# Patient Record
Sex: Female | Born: 1970 | Race: Asian | Hispanic: No | Marital: Married | Smoking: Never smoker
Health system: Southern US, Community
[De-identification: ages and names within clinical notes are randomized; demographics above are authoritative.]

## PROBLEM LIST (undated history)

## (undated) ENCOUNTER — Inpatient Hospital Stay (HOSPITAL_COMMUNITY): Payer: Self-pay

## (undated) DIAGNOSIS — F329 Major depressive disorder, single episode, unspecified: Secondary | ICD-10-CM

## (undated) DIAGNOSIS — F32A Depression, unspecified: Secondary | ICD-10-CM

## (undated) DIAGNOSIS — Z789 Other specified health status: Secondary | ICD-10-CM

## (undated) HISTORY — PX: HAND SURGERY: SHX662

---

## 2014-05-23 LAB — PREGNANCY, URINE: Preg Test, Ur: POSITIVE

## 2014-06-13 ENCOUNTER — Encounter (HOSPITAL_COMMUNITY): Payer: Self-pay | Admitting: *Deleted

## 2014-06-13 ENCOUNTER — Inpatient Hospital Stay (HOSPITAL_COMMUNITY): Payer: Medicaid Other

## 2014-06-13 ENCOUNTER — Inpatient Hospital Stay (HOSPITAL_COMMUNITY)
Admission: AD | Admit: 2014-06-13 | Discharge: 2014-06-13 | Disposition: A | Discharge status: Home / Self Care | Payer: Medicaid Other | Source: Ambulatory Visit | Attending: Obstetrics & Gynecology | Admitting: Obstetrics & Gynecology

## 2014-06-13 DIAGNOSIS — O9989 Other specified diseases and conditions complicating pregnancy, childbirth and the puerperium: Secondary | ICD-10-CM | POA: Insufficient documentation

## 2014-06-13 DIAGNOSIS — Z3A20 20 weeks gestation of pregnancy: Secondary | ICD-10-CM | POA: Insufficient documentation

## 2014-06-13 DIAGNOSIS — O0992 Supervision of high risk pregnancy, unspecified, second trimester: Secondary | ICD-10-CM

## 2014-06-13 DIAGNOSIS — R109 Unspecified abdominal pain: Secondary | ICD-10-CM | POA: Insufficient documentation

## 2014-06-13 DIAGNOSIS — O0932 Supervision of pregnancy with insufficient antenatal care, second trimester: Secondary | ICD-10-CM | POA: Insufficient documentation

## 2014-06-13 DIAGNOSIS — O09212 Supervision of pregnancy with history of pre-term labor, second trimester: Secondary | ICD-10-CM

## 2014-06-13 DIAGNOSIS — O09512 Supervision of elderly primigravida, second trimester: Secondary | ICD-10-CM | POA: Diagnosis present

## 2014-06-13 DIAGNOSIS — O26899 Other specified pregnancy related conditions, unspecified trimester: Secondary | ICD-10-CM

## 2014-06-13 DIAGNOSIS — O09892 Supervision of other high risk pregnancies, second trimester: Secondary | ICD-10-CM

## 2014-06-13 DIAGNOSIS — O0930 Supervision of pregnancy with insufficient antenatal care, unspecified trimester: Secondary | ICD-10-CM

## 2014-06-13 DIAGNOSIS — Z789 Other specified health status: Secondary | ICD-10-CM | POA: Diagnosis present

## 2014-06-13 HISTORY — DX: Other specified health status: Z78.9

## 2014-06-13 HISTORY — DX: Major depressive disorder, single episode, unspecified: F32.9

## 2014-06-13 HISTORY — DX: Depression, unspecified: F32.A

## 2014-06-13 LAB — OB RESULTS CONSOLE ABO/RH: RH TYPE: POSITIVE

## 2014-06-13 LAB — URINALYSIS, ROUTINE W REFLEX MICROSCOPIC
Bilirubin Urine: NEGATIVE
GLUCOSE, UA: NEGATIVE mg/dL
Hgb urine dipstick: NEGATIVE
Ketones, ur: NEGATIVE mg/dL
Leukocytes, UA: NEGATIVE
NITRITE: NEGATIVE
PH: 6 (ref 5.0–8.0)
Protein, ur: NEGATIVE mg/dL
SPECIFIC GRAVITY, URINE: 1.015 (ref 1.005–1.030)
Urobilinogen, UA: 0.2 mg/dL (ref 0.0–1.0)

## 2014-06-13 LAB — DIFFERENTIAL
BASOS PCT: 0 % (ref 0–1)
Basophils Absolute: 0 10*3/uL (ref 0.0–0.1)
Eosinophils Absolute: 0.2 10*3/uL (ref 0.0–0.7)
Eosinophils Relative: 2 % (ref 0–5)
Lymphocytes Relative: 18 % (ref 12–46)
Lymphs Abs: 2.1 10*3/uL (ref 0.7–4.0)
MONO ABS: 0.5 10*3/uL (ref 0.1–1.0)
MONOS PCT: 4 % (ref 3–12)
Neutro Abs: 9 10*3/uL — ABNORMAL HIGH (ref 1.7–7.7)
Neutrophils Relative %: 76 % (ref 43–77)

## 2014-06-13 LAB — CBC
HEMATOCRIT: 33 % — AB (ref 36.0–46.0)
HEMOGLOBIN: 11 g/dL — AB (ref 12.0–15.0)
MCH: 29.7 pg (ref 26.0–34.0)
MCHC: 33.3 g/dL (ref 30.0–36.0)
MCV: 89.2 fL (ref 78.0–100.0)
Platelets: 288 10*3/uL (ref 150–400)
RBC: 3.7 MIL/uL — ABNORMAL LOW (ref 3.87–5.11)
RDW: 13.4 % (ref 11.5–15.5)
WBC: 11.8 10*3/uL — ABNORMAL HIGH (ref 4.0–10.5)

## 2014-06-13 LAB — ABO/RH: ABO/RH(D): AB POS

## 2014-06-13 LAB — WET PREP, GENITAL
CLUE CELLS WET PREP: NONE SEEN
Trich, Wet Prep: NONE SEEN
Yeast Wet Prep HPF POC: NONE SEEN

## 2014-06-13 LAB — TYPE AND SCREEN
ABO/RH(D): AB POS
Antibody Screen: NEGATIVE

## 2014-06-13 NOTE — MAU Note (Signed)
Has had blood work done in office for preg., not planning to go there for PNV due to insurance.  Having abd pain, pain with urination, also wants to know sex of baby.  Pain started on Friday. Also has freq of urination

## 2014-06-13 NOTE — Discharge Instructions (Signed)
Preterm Labor Information Preterm labor is when labor starts at less than 37 weeks of pregnancy. The normal length of a pregnancy is 39 to 41 weeks. CAUSES Often, there is no identifiable underlying cause as to why a woman goes into preterm labor. One of the most common known causes of preterm labor is infection. Infections of the uterus, cervix, vagina, amniotic sac, bladder, kidney, or even the lungs (pneumonia) can cause labor to start. Other suspected causes of preterm labor include:   Urogenital infections, such as yeast infections and bacterial vaginosis.   Uterine abnormalities (uterine shape, uterine septum, fibroids, or bleeding from the placenta).   A cervix that has been operated on (it may fail to stay closed).   Malformations in the fetus.   Multiple gestations (twins, triplets, and so on).   Breakage of the amniotic sac.  RISK FACTORS  Having a previous history of preterm labor.   Having premature rupture of membranes (PROM).   Having a placenta that covers the opening of the cervix (placenta previa).   Having a placenta that separates from the uterus (placental abruption).   Having a cervix that is too weak to hold the fetus in the uterus (incompetent cervix).   Having too much fluid in the amniotic sac (polyhydramnios).   Taking illegal drugs or smoking while pregnant.   Not gaining enough weight while pregnant.   Being younger than 4918 and older than 41 years old.   Having a low socioeconomic status.   Being African American. SYMPTOMS Signs and symptoms of preterm labor include:   Menstrual-like cramps, abdominal pain, or back pain.  Uterine contractions that are regular, as frequent as six in an hour, regardless of their intensity (may be mild or painful).  Contractions that start on the top of the uterus and spread down to the lower abdomen and back.   A sense of increased pelvic pressure.   A watery or bloody mucus discharge that  comes from the vagina.  TREATMENT Depending on the length of the pregnancy and other circumstances, your health care provider may suggest bed rest. If necessary, there are medicines that can be given to stop contractions and to mature the fetal lungs. If labor happens before 34 weeks of pregnancy, a prolonged hospital stay may be recommended. Treatment depends on the condition of both you and the fetus.  WHAT SHOULD YOU DO IF YOU THINK YOU ARE IN PRETERM LABOR? Call your health care provider right away. You will need to go to the hospital to get checked immediately. HOW CAN YOU PREVENT PRETERM LABOR IN FUTURE PREGNANCIES? You should:   Stop smoking if you smoke.  Maintain healthy weight gain and avoid chemicals and drugs that are not necessary.  Be watchful for any type of infection.  Inform your health care provider if you have a known history of preterm labor. Document Released: 08/23/2003 Document Revised: 02/02/2013 Document Reviewed: 07/05/2012 [facility] Patient Information 2015 [place], [place]. This information is not intended to replace advice given to you by your health care provider. Make sure you discuss any questions you have with your health care provider.  Second Trimester of Pregnancy The second trimester is from week 13 through week 28, month 4 through 6. This is often the time in pregnancy that you feel your best. Often times, morning sickness has lessened or quit. You may have more energy, and you may get hungry more often. Your unborn baby (fetus) is growing rapidly. At the end of the sixth month,  he or she is about 9 inches long and weighs about 1 pounds. You will likely feel the baby move (quickening) between 18 and 20 weeks of pregnancy. HOME CARE   Avoid all smoking, herbs, and alcohol. Avoid drugs not approved by your doctor.  Only take medicine as told by your doctor. Some medicines are safe and some are not during pregnancy.  Exercise only as told by your doctor.  Stop exercising if you start having cramps.  Eat regular, healthy meals.  Wear a good support bra if your breasts are tender.  Do not use hot tubs, steam rooms, or saunas.  Wear your seat belt when driving.  Avoid raw meat, uncooked cheese, and liter boxes and soil used by cats.  Take your prenatal vitamins.  Try taking medicine that helps you poop (stool softener) as needed, and if your doctor approves. Eat more fiber by eating fresh fruit, vegetables, and whole grains. Drink enough fluids to keep your pee (urine) clear or pale yellow.  Take warm water baths (sitz baths) to soothe pain or discomfort caused by hemorrhoids. Use hemorrhoid cream if your doctor approves.  If you have puffy, bulging veins (varicose veins), wear support hose. Raise (elevate) your feet for 15 minutes, 3-4 times a day. Limit salt in your diet.  Avoid heavy lifting, wear low heals, and sit up straight.  Rest with your legs raised if you have leg cramps or low back pain.  Visit your dentist if you have not gone during your pregnancy. Use a soft toothbrush to brush your teeth. Be gentle when you floss.  You can have sex (intercourse) unless your doctor tells you not to.  Go to your doctor visits. GET HELP IF:   You feel dizzy.  You have mild cramps or pressure in your lower belly (abdomen).  You have a nagging pain in your belly area.  You continue to feel sick to your stomach (nauseous), throw up (vomit), or have watery poop (diarrhea).  You have bad smelling fluid coming from your vagina.  You have pain with peeing (urination). GET HELP RIGHT AWAY IF:   You have a fever.  You are leaking fluid from your vagina.  You have spotting or bleeding from your vagina.  You have severe belly cramping or pain.  You lose or gain weight rapidly.  You have trouble catching your breath and have chest pain.  You notice sudden or extreme puffiness (swelling) of your face, hands, ankles, feet, or  legs.  You have not felt the baby move in over an hour.  You have severe headaches that do not go away with medicine.  You have vision changes. Document Released: 08/27/2009 Document Revised: 09/27/2012 Document Reviewed: 08/03/2012 [facility] Patient Information 2015 [place]Care, [place]LLC. This information is not intended to replace advice given to you by your health care provider. Make sure you discuss any questions you have with your health care provider.

## 2014-06-13 NOTE — MAU Provider Note (Signed)
Chief Complaint: Abdominal Pain and Dysuria  First Provider Initiated Contact with Patient 06/13/14 1226     SUBJECTIVE HPI: Tracie Tucker is a 43 y.o. [id number] at [redacted]w[redacted]d per pregnancy verification letter from [facility]. Uncertain LMP in July 2015. She presents with low abd cramping, dysuria and frequency of urination since 06/09/14. Worse initially, but now 2/10 on pain scale. No PNC except for ?blood pregnancy test at [facility]. Will not be getting PNC there because she does not have insurance and will be applying for emergency Medicaid. Wants to know sex of baby and initiate PNC.   All other deliveries in [place]. No PNC. Homebirths. Three neonatal deaths w/ in a few days after birth presumably from prematurity. Pt states she was 7-8 months with those babies.    Attempted to get [place] interpreter using [employer]. None available. Pt's friend at [facility]. Marliss Czar and [place]. Interpreted since no other interpreter available.  Past Medical History  Diagnosis Date  . Medical history non-contributory   . Depression     in [place], everything is ok here   OB History  Gravida Para Term Preterm AB SAB TAB Ectopic Multiple Living  8 7 4 3  0 0 0   4    # Outcome Date GA Lbr Len/2nd Weight Sex Delivery Anes PTL Lv  8 Current           7 Preterm           6 Preterm           5 Preterm           4 Term           3 Term           2 Term           1 Term             Obstetric Comments  The preterm del were 7-66months, babies lived a few days then died. All deliveries were in [place]   Past Surgical History  Procedure Laterality Date  . No past surgeries     History   Social History  . Marital Status: Married    Spouse Name: N/A    Number of Children: N/A  . Years of Education: N/A   Occupational History  . Not on file.   Social History Main Topics  . Smoking status: Never Smoker   . Smokeless tobacco: Never Used  . Alcohol Use: No  . Drug Use: No  . Sexual  Activity: Not on file   Other Topics Concern  . Not on file   Social History Narrative  . No narrative on file   No current facility-administered medications on file prior to encounter.   No current outpatient prescriptions on file prior to encounter.   No Known Allergies  ROS: Pertinent positive items in HPI. Neg for fever, chills, hematuria, flank pain, VB, LOF, vaginal discharge.   OBJECTIVE Blood pressure 123/77, pulse 72, temperature 98.9 F (37.2 C), temperature source Oral, resp. rate 16, weight 55.339 kg (122 lb). GENERAL: Well-developed, well-nourished female in no acute distress.  HEENT: Normocephalic HEART: normal rate RESP: normal effort ABDOMEN: Soft, mild SP TTP. Pos BS. No CVAT. Gravid. 21 cm fundal height.  EXTREMITIES: Nontender, no edema NEURO: Alert and oriented SPECULUM EXAM: NEFG, physiologic discharge, no blood noted, cervix clean BIMANUAL: cervix closed and long; uterus 20-22 week size, no adnexal tenderness or masses FHR 136 by doppler.   LAB  RESULTS Results for orders placed or performed during the hospital encounter of 06/13/14 (from the past 24 hour(s))  Urinalysis, Routine w reflex microscopic     Status: None   Collection Time: 06/13/14 11:29 AM  Result Value Ref Range   Color, Urine YELLOW YELLOW   APPearance CLEAR CLEAR   Specific Gravity, Urine 1.015 1.005 - 1.030   pH 6.0 5.0 - 8.0   Glucose, UA NEGATIVE NEGATIVE mg/dL   Hgb urine dipstick NEGATIVE NEGATIVE   Bilirubin Urine NEGATIVE NEGATIVE   Ketones, ur NEGATIVE NEGATIVE mg/dL   Protein, ur NEGATIVE NEGATIVE mg/dL   Urobilinogen, UA 0.2 0.0 - 1.0 mg/dL   Nitrite NEGATIVE NEGATIVE   Leukocytes, UA NEGATIVE NEGATIVE  Wet prep, genital     Status: Abnormal   Collection Time: 06/13/14 12:40 PM  Result Value Ref Range   Yeast Wet Prep HPF POC NONE SEEN NONE SEEN   Trich, Wet Prep NONE SEEN NONE SEEN   Clue Cells Wet Prep HPF POC NONE SEEN NONE SEEN   WBC, Wet Prep HPF POC FEW (A)  NONE SEEN  CBC     Status: Abnormal   Collection Time: 06/13/14  1:21 PM  Result Value Ref Range   WBC 11.8 (H) 4.0 - 10.5 K/uL   RBC 3.70 (L) 3.87 - 5.11 MIL/uL   Hemoglobin 11.0 (L) 12.0 - 15.0 g/dL   HCT 16.133.0 (L) 09.636.0 - 04.546.0 %   MCV 89.2 78.0 - 100.0 fL   MCH 29.7 26.0 - 34.0 pg   MCHC 33.3 30.0 - 36.0 g/dL   RDW 40.913.4 81.111.5 - 91.415.5 %   Platelets 288 150 - 400 K/uL  Differential     Status: Abnormal   Collection Time: 06/13/14  1:21 PM  Result Value Ref Range   Neutrophils Relative % 76 43 - 77 %   Neutro Abs 9.0 (H) 1.7 - 7.7 K/uL   Lymphocytes Relative 18 12 - 46 %   Lymphs Abs 2.1 0.7 - 4.0 K/uL   Monocytes Relative 4 3 - 12 %   Monocytes Absolute 0.5 0.1 - 1.0 K/uL   Eosinophils Relative 2 0 - 5 %   Eosinophils Absolute 0.2 0.0 - 0.7 K/uL   Basophils Relative 0 0 - 1 %   Basophils Absolute 0.0 0.0 - 0.1 K/uL    IMAGING CL 4.02 cm  MAU COURSE UA, Ob Limited, OB urine culture and Ob panel.   ASSESSMENT 1. History of preterm delivery, currently pregnant in second trimester   2. Abdominal pain affecting pregnancy, antepartum   3. No prenatal care in current pregnancy in second trimester    PLAN Discharge home in stable condition. [facility].  Needs 17-P for Hx PTD. Urine culture GC/Chlm pending Follow-up Information    [facility]   Specialty:  Obstetrics and Gynecology   Why:  will call you to schedule an appointment   Contact information:   [address] [place] [id number] [phone]      [facility].   Why:  As needed in emergencies   Contact information:   [address] [id number] mc [place] [id number] [phone]       Medication List    TAKE these medications        prenatal multivitamin Tabs tablet  Take 1 tablet by mouth daily at 12 noon.  [place] Valley[place]ia Ramello Cordial, Pennsylvania[place] IslandCNM 06/13/2014   12:24 PM

## 2014-06-14 LAB — GC/CHLAMYDIA PROBE AMP
CT Probe RNA: NEGATIVE
GC PROBE AMP APTIMA: NEGATIVE

## 2014-06-14 LAB — CULTURE, OB URINE: SPECIAL REQUESTS: NORMAL

## 2014-06-14 LAB — RPR

## 2014-06-14 LAB — HIV ANTIBODY (ROUTINE TESTING W REFLEX): HIV 1&2 Ab, 4th Generation: NONREACTIVE

## 2014-06-14 LAB — RUBELLA SCREEN: Rubella: 19.9 Index — ABNORMAL HIGH (ref <0.90)

## 2014-06-14 LAB — HEPATITIS B SURFACE ANTIGEN: Hepatitis B Surface Ag: NEGATIVE

## 2014-06-16 NOTE — L&D Delivery Note (Cosign Needed)
Delivery Note I was called to MAU to attend this delivery that had just happened with Awilda BillJen Rasch NP present.  At 2:04 AM a viable female was delivered via Vaginal, Spontaneous Delivery (Presentation: ; Occiput Anterior).  APGAR: 9, 9; weight 5 lb 15.2 oz (2700 g).  When I arrived, infant had been dried and was on pt's abd. I clamped and cut the cord and collected the hospital cord blood sample tubes. Placenta status: Intact, Spontaneous.  Cord: 3 vessels with the following complications: None.  FF after delivery but Pit 10mu IM was given as a precaution.  Anesthesia: None  Episiotomy: None Lacerations: None Est. Blood Loss (mL): 250  Mom to postpartum.  Baby to Couplet care / Skin to Skin.  Cam HaiSHAW, Kendry Pfarr CNM 10/14/2014, 10:21 AM

## 2014-06-20 ENCOUNTER — Other Ambulatory Visit (HOSPITAL_COMMUNITY): Payer: Medicaid Other

## 2014-06-22 ENCOUNTER — Ambulatory Visit (HOSPITAL_COMMUNITY)
Admit: 2014-06-22 | Discharge: 2014-06-22 | Disposition: A | Discharge status: Home / Self Care | Payer: Medicaid Other | Attending: Advanced Practice Midwife | Admitting: Advanced Practice Midwife

## 2014-06-22 ENCOUNTER — Other Ambulatory Visit (HOSPITAL_COMMUNITY): Payer: Self-pay | Admitting: Advanced Practice Midwife

## 2014-06-22 ENCOUNTER — Encounter: Payer: Self-pay | Admitting: Advanced Practice Midwife

## 2014-06-22 DIAGNOSIS — O09522 Supervision of elderly multigravida, second trimester: Secondary | ICD-10-CM | POA: Diagnosis not present

## 2014-06-22 DIAGNOSIS — O09212 Supervision of pregnancy with history of pre-term labor, second trimester: Secondary | ICD-10-CM | POA: Insufficient documentation

## 2014-06-22 DIAGNOSIS — R109 Unspecified abdominal pain: Principal | ICD-10-CM

## 2014-06-22 DIAGNOSIS — O35BXX0 Maternal care for other (suspected) fetal abnormality and damage, fetal cardiac anomalies, not applicable or unspecified: Secondary | ICD-10-CM | POA: Insufficient documentation

## 2014-06-22 DIAGNOSIS — Z3A21 21 weeks gestation of pregnancy: Secondary | ICD-10-CM | POA: Insufficient documentation

## 2014-06-22 DIAGNOSIS — O09892 Supervision of other high risk pregnancies, second trimester: Secondary | ICD-10-CM

## 2014-06-22 DIAGNOSIS — O0932 Supervision of pregnancy with insufficient antenatal care, second trimester: Secondary | ICD-10-CM | POA: Diagnosis not present

## 2014-06-22 DIAGNOSIS — O09512 Supervision of elderly primigravida, second trimester: Secondary | ICD-10-CM

## 2014-06-22 DIAGNOSIS — O0992 Supervision of high risk pregnancy, unspecified, second trimester: Secondary | ICD-10-CM

## 2014-06-22 DIAGNOSIS — Z3689 Encounter for other specified antenatal screening: Secondary | ICD-10-CM | POA: Insufficient documentation

## 2014-06-22 DIAGNOSIS — Z36 Encounter for antenatal screening of mother: Secondary | ICD-10-CM | POA: Diagnosis present

## 2014-06-22 DIAGNOSIS — O26899 Other specified pregnancy related conditions, unspecified trimester: Secondary | ICD-10-CM

## 2014-06-22 DIAGNOSIS — O09292 Supervision of pregnancy with other poor reproductive or obstetric history, second trimester: Secondary | ICD-10-CM | POA: Diagnosis not present

## 2014-06-22 DIAGNOSIS — O358XX0 Maternal care for other (suspected) fetal abnormality and damage, not applicable or unspecified: Secondary | ICD-10-CM | POA: Insufficient documentation

## 2014-06-26 ENCOUNTER — Ambulatory Visit (INDEPENDENT_AMBULATORY_CARE_PROVIDER_SITE_OTHER): Payer: Medicaid Other | Admitting: Family Medicine

## 2014-06-26 ENCOUNTER — Other Ambulatory Visit (HOSPITAL_COMMUNITY)
Admission: RE | Admit: 2014-06-26 | Discharge: 2014-06-26 | Disposition: A | Discharge status: Home / Self Care | Payer: Medicaid Other | Source: Ambulatory Visit | Attending: Family Medicine | Admitting: Family Medicine

## 2014-06-26 ENCOUNTER — Encounter: Payer: Self-pay | Admitting: Family Medicine

## 2014-06-26 ENCOUNTER — Telehealth: Payer: Self-pay

## 2014-06-26 VITALS — BP 107/72 | HR 75 | Temp 97.6°F | Ht 60.0 in | Wt 123.4 lb

## 2014-06-26 DIAGNOSIS — O09512 Supervision of elderly primigravida, second trimester: Secondary | ICD-10-CM

## 2014-06-26 DIAGNOSIS — Z1151 Encounter for screening for human papillomavirus (HPV): Secondary | ICD-10-CM | POA: Diagnosis present

## 2014-06-26 DIAGNOSIS — Z01419 Encounter for gynecological examination (general) (routine) without abnormal findings: Secondary | ICD-10-CM | POA: Insufficient documentation

## 2014-06-26 DIAGNOSIS — O09892 Supervision of other high risk pregnancies, second trimester: Secondary | ICD-10-CM

## 2014-06-26 DIAGNOSIS — O0992 Supervision of high risk pregnancy, unspecified, second trimester: Secondary | ICD-10-CM

## 2014-06-26 DIAGNOSIS — O09212 Supervision of pregnancy with history of pre-term labor, second trimester: Secondary | ICD-10-CM

## 2014-06-26 MED ORDER — HYDROXYPROGESTERONE CAPROATE 250 MG/ML IM OIL: 250.0000 mg | TOPICAL_OIL | Freq: Once | INTRAMUSCULAR | Status: DC

## 2014-06-26 NOTE — Patient Instructions (Signed)
Second Trimester of Pregnancy The second trimester is from week 13 through week 28, months 4 through 6. The second trimester is often a time when you feel your best. Your body has also adjusted to being pregnant, and you begin to feel better physically. Usually, morning sickness has lessened or quit completely, you may have more energy, and you may have an increase in appetite. The second trimester is also a time when the fetus is growing rapidly. At the end of the sixth month, the fetus is about 9 inches long and weighs about 1 pounds. You will likely begin to feel the baby move (quickening) between 18 and 20 weeks of the pregnancy. BODY CHANGES Your body goes through many changes during pregnancy. The changes vary from woman to woman.   Your weight will continue to increase. You will notice your lower abdomen bulging out.  You may begin to get stretch marks on your hips, abdomen, and breasts.  You may develop headaches that can be relieved by medicines approved by your health care provider.  You may urinate more often because the fetus is pressing on your bladder.  You may develop or continue to have heartburn as a result of your pregnancy.  You may develop constipation because certain hormones are causing the muscles that push waste through your intestines to slow down.  You may develop hemorrhoids or swollen, bulging veins (varicose veins).  You may have back pain because of the weight gain and pregnancy hormones relaxing your joints between the bones in your pelvis and as a result of a shift in weight and the muscles that support your balance.  Your breasts will continue to grow and be tender.  Your gums may bleed and may be sensitive to brushing and flossing.  Dark spots or blotches (chloasma, mask of pregnancy) may develop on your face. This will likely fade after the baby is born.  A dark line from your belly button to the pubic area (linea nigra) may appear. This will likely  fade after the baby is born.  You may have changes in your hair. These can include thickening of your hair, rapid growth, and changes in texture. Some women also have hair loss during or after pregnancy, or hair that feels dry or thin. Your hair will most likely return to normal after your baby is born. WHAT TO EXPECT AT YOUR PRENATAL VISITS During a routine prenatal visit:  You will be weighed to make sure you and the fetus are growing normally.  Your blood pressure will be taken.  Your abdomen will be measured to track your baby's growth.  The fetal heartbeat will be listened to.  Any test results from the previous visit will be discussed. Your health care provider may ask you:  How you are feeling.  If you are feeling the baby move.  If you have had any abnormal symptoms, such as leaking fluid, bleeding, severe headaches, or abdominal cramping.  If you have any questions. Other tests that may be performed during your second trimester include:  Blood tests that check for:  Low iron levels (anemia).  Gestational diabetes (between 24 and 28 weeks).  Rh antibodies.  Urine tests to check for infections, diabetes, or protein in the urine.  An ultrasound to confirm the proper growth and development of the baby.  An amniocentesis to check for possible genetic problems.  Fetal screens for spina bifida and Down syndrome. HOME CARE INSTRUCTIONS   Avoid all smoking, herbs, alcohol, and unprescribed   drugs. These chemicals affect the formation and growth of the baby.  Follow your health care provider's instructions regarding medicine use. There are medicines that are either safe or unsafe to take during pregnancy.  Exercise only as directed by your health care provider. Experiencing uterine cramps is a good sign to stop exercising.  Continue to eat regular, healthy meals.  Wear a good support bra for breast tenderness.  Do not use hot tubs, steam rooms, or saunas.  Wear  your seat belt at all times when driving.  Avoid raw meat, uncooked cheese, cat litter boxes, and soil used by cats. These carry germs that can cause birth defects in the baby.  Take your prenatal vitamins.  Try taking a stool softener (if your health care provider approves) if you develop constipation. Eat more high-fiber foods, such as fresh vegetables or fruit and whole grains. Drink plenty of fluids to keep your urine clear or pale yellow.  Take warm sitz baths to soothe any pain or discomfort caused by hemorrhoids. Use hemorrhoid cream if your health care provider approves.  If you develop varicose veins, wear support hose. Elevate your feet for 15 minutes, 3-4 times a day. Limit salt in your diet.  Avoid heavy lifting, wear low heel shoes, and practice good posture.  Rest with your legs elevated if you have leg cramps or low back pain.  Visit your dentist if you have not gone yet during your pregnancy. Use a soft toothbrush to brush your teeth and be gentle when you floss.  A sexual relationship may be continued unless your health care provider directs you otherwise.  Continue to go to all your prenatal visits as directed by your health care provider. SEEK MEDICAL CARE IF:   You have dizziness.  You have mild pelvic cramps, pelvic pressure, or nagging pain in the abdominal area.  You have persistent nausea, vomiting, or diarrhea.  You have a bad smelling vaginal discharge.  You have pain with urination. SEEK IMMEDIATE MEDICAL CARE IF:   You have a fever.  You are leaking fluid from your vagina.  You have spotting or bleeding from your vagina.  You have severe abdominal cramping or pain.  You have rapid weight gain or loss.  You have shortness of breath with chest pain.  You notice sudden or extreme swelling of your face, hands, ankles, feet, or legs.  You have not felt your baby move in over an hour.  You have severe headaches that do not go away with  medicine.  You have vision changes. Document Released: 05/27/2001 Document Revised: 06/07/2013 Document Reviewed: 08/03/2012 Exit[employer] Patient Information 2015 [employer]. This information is not intended to replace advice given to you by your health care provider. Make sure you discuss any questions you have with your health care provider.  Breastfeeding Deciding to breastfeed is one of the best choices you can make for you and your baby. A change in hormones during pregnancy causes your breast tissue to grow and increases the number and size of your milk ducts. These hormones also allow proteins, sugars, and fats from your blood supply to make breast milk in your milk-producing glands. Hormones prevent breast milk from being released before your baby is born as well as prompt milk flow after birth. Once breastfeeding has begun, thoughts of your baby, as well as his or her sucking or crying, can stimulate the release of milk from your milk-producing glands.  BENEFITS OF BREASTFEEDING For Your Baby  Your first   milk (colostrum) helps your baby's digestive system function better.   There are antibodies in your milk that help your baby fight off infections.   Your baby has a lower incidence of asthma, allergies, and sudden infant death syndrome.   The nutrients in breast milk are better for your baby than infant formulas and are designed uniquely for your baby's needs.   Breast milk improves your baby's brain development.   Your baby is less likely to develop other conditions, such as childhood obesity, asthma, or type 2 diabetes mellitus.  For You   Breastfeeding helps to create a very special bond between you and your baby.   Breastfeeding is convenient. Breast milk is always available at the correct temperature and costs nothing.   Breastfeeding helps to burn calories and helps you lose the weight gained during pregnancy.   Breastfeeding makes your uterus contract to its  prepregnancy size faster and slows bleeding (lochia) after you give birth.   Breastfeeding helps to lower your risk of developing type 2 diabetes mellitus, osteoporosis, and breast or ovarian cancer later in life. SIGNS THAT YOUR BABY IS HUNGRY Early Signs of Hunger  Increased alertness or activity.  Stretching.  Movement of the head from side to side.  Movement of the head and opening of the mouth when the corner of the mouth or cheek is stroked (rooting).  Increased sucking sounds, smacking lips, cooing, sighing, or squeaking.  Hand-to-mouth movements.  Increased sucking of fingers or hands. Late Signs of Hunger  Fussing.  Intermittent crying. Extreme Signs of Hunger Signs of extreme hunger will require calming and consoling before your baby will be able to breastfeed successfully. Do not wait for the following signs of extreme hunger to occur before you initiate breastfeeding:   Restlessness.  A loud, strong cry.   Screaming. BREASTFEEDING BASICS Breastfeeding Initiation  Find a comfortable place to sit or lie down, with your neck and back well supported.  Place a pillow or rolled up blanket under your baby to bring him or her to the level of your breast (if you are seated). Nursing pillows are specially designed to help support your arms and your baby while you breastfeed.  Make sure that your baby's abdomen is facing your abdomen.   Gently massage your breast. With your fingertips, massage from your chest wall toward your nipple in a circular motion. This encourages milk flow. You may need to continue this action during the feeding if your milk flows slowly.  Support your breast with 4 fingers underneath and your thumb above your nipple. Make sure your fingers are well away from your nipple and your baby's mouth.   Stroke your baby's lips gently with your finger or nipple.   When your baby's mouth is open wide enough, quickly bring your baby to your breast,  placing your entire nipple and as much of the colored area around your nipple (areola) as possible into your baby's mouth.   More areola should be visible above your baby's upper lip than below the lower lip.   Your baby's tongue should be between his or her lower gum and your breast.   Ensure that your baby's mouth is correctly positioned around your nipple (latched). Your baby's lips should create a seal on your breast and be turned out (everted).  It is common for your baby to suck about 2-3 minutes in order to start the flow of breast milk. Latching Teaching your baby how to latch on to your breast   properly is very important. An improper latch can cause nipple pain and decreased milk supply for you and poor weight gain in your baby. Also, if your baby is not latched onto your nipple properly, he or she may swallow some air during feeding. This can make your baby fussy. Burping your baby when you switch breasts during the feeding can help to get rid of the air. However, teaching your baby to latch on properly is still the best way to prevent fussiness from swallowing air while breastfeeding. Signs that your baby has successfully latched on to your nipple:    Silent tugging or silent sucking, without causing you pain.   Swallowing heard between every 3-4 sucks.    Muscle movement above and in front of his or her ears while sucking.  Signs that your baby has not successfully latched on to nipple:   Sucking sounds or smacking sounds from your baby while breastfeeding.  Nipple pain. If you think your baby has not latched on correctly, slip your finger into the corner of your baby's mouth to break the suction and place it between your baby's gums. Attempt breastfeeding initiation again. Signs of Successful Breastfeeding Signs from your baby:   A gradual decrease in the number of sucks or complete cessation of sucking.   Falling asleep.   Relaxation of his or her body.    Retention of a small amount of milk in his or her mouth.   Letting go of your breast by himself or herself. Signs from you:  Breasts that have increased in firmness, weight, and size 1-3 hours after feeding.   Breasts that are softer immediately after breastfeeding.  Increased milk volume, as well as a change in milk consistency and color by the fifth day of breastfeeding.   Nipples that are not sore, cracked, or bleeding. Signs That Your Baby is Getting Enough Milk  Wetting at least 3 diapers in a 24-hour period. The urine should be clear and pale yellow by age 5 days.  At least 3 stools in a 24-hour period by age 5 days. The stool should be soft and yellow.  At least 3 stools in a 24-hour period by age 7 days. The stool should be seedy and yellow.  No loss of weight greater than 10% of birth weight during the first 3 days of age.  Average weight gain of 4-7 ounces (113-198 g) per week after age 4 days.  Consistent daily weight gain by age 5 days, without weight loss after the age of 2 weeks. After a feeding, your baby may spit up a small amount. This is common. BREASTFEEDING FREQUENCY AND DURATION Frequent feeding will help you make more milk and can prevent sore nipples and breast engorgement. Breastfeed when you feel the need to reduce the fullness of your breasts or when your baby shows signs of hunger. This is called "breastfeeding on demand." Avoid introducing a pacifier to your baby while you are working to establish breastfeeding (the first 4-6 weeks after your baby is born). After this time you may choose to use a pacifier. Research has shown that pacifier use during the first year of a baby's life decreases the risk of sudden infant death syndrome (SIDS). Allow your baby to feed on each breast as long as he or she wants. Breastfeed until your baby is finished feeding. When your baby unlatches or falls asleep while feeding from the first breast, offer the second breast.  Because newborns are often sleepy in the   first few weeks of life, you may need to awaken your baby to get him or her to feed. Breastfeeding times will vary from baby to baby. However, the following rules can serve as a guide to help you ensure that your baby is properly fed:  Newborns (babies 4 weeks of age or younger) may breastfeed every 1-3 hours.  Newborns should not go longer than 3 hours during the day or 5 hours during the night without breastfeeding.  You should breastfeed your baby a minimum of 8 times in a 24-hour period until you begin to introduce solid foods to your baby at around 6 months of age. BREAST MILK PUMPING Pumping and storing breast milk allows you to ensure that your baby is exclusively fed your breast milk, even at times when you are unable to breastfeed. This is especially important if you are going back to work while you are still breastfeeding or when you are not able to be present during feedings. Your lactation consultant can give you guidelines on how long it is safe to store breast milk.  A breast pump is a machine that allows you to pump milk from your breast into a sterile bottle. The pumped breast milk can then be stored in a refrigerator or freezer. Some breast pumps are operated by hand, while others use electricity. Ask your lactation consultant which type will work best for you. Breast pumps can be purchased, but some hospitals and breastfeeding support groups lease breast pumps on a monthly basis. A lactation consultant can teach you how to hand express breast milk, if you prefer not to use a pump.  CARING FOR YOUR BREASTS WHILE YOU BREASTFEED Nipples can become dry, cracked, and sore while breastfeeding. The following recommendations can help keep your breasts moisturized and healthy:  Avoid using soap on your nipples.   Wear a supportive bra. Although not required, special nursing bras and tank tops are designed to allow access to your breasts for  breastfeeding without taking off your entire bra or top. Avoid wearing underwire-style bras or extremely tight bras.  Air dry your nipples for 3-4minutes after each feeding.   Use only cotton bra pads to absorb leaked breast milk. Leaking of breast milk between feedings is normal.   Use lanolin on your nipples after breastfeeding. Lanolin helps to maintain your skin's normal moisture barrier. If you use pure lanolin, you do not need to wash it off before feeding your baby again. Pure lanolin is not toxic to your baby. You may also hand express a few drops of breast milk and gently massage that milk into your nipples and allow the milk to air dry. In the first few weeks after giving birth, some women experience extremely full breasts (engorgement). Engorgement can make your breasts feel heavy, warm, and tender to the touch. Engorgement peaks within 3-5 days after you give birth. The following recommendations can help ease engorgement:  Completely empty your breasts while breastfeeding or pumping. You may want to start by applying warm, moist heat (in the shower or with warm water-soaked hand towels) just before feeding or pumping. This increases circulation and helps the milk flow. If your baby does not completely empty your breasts while breastfeeding, pump any extra milk after he or she is finished.  Wear a snug bra (nursing or regular) or tank top for 1-2 days to signal your body to slightly decrease milk production.  Apply ice packs to your breasts, unless this is too uncomfortable for you.    Make sure that your baby is latched on and positioned properly while breastfeeding. If engorgement persists after 48 hours of following these recommendations, contact your health care provider or a lactation consultant. OVERALL HEALTH CARE RECOMMENDATIONS WHILE BREASTFEEDING  Eat healthy foods. Alternate between meals and snacks, eating 3 of each per day. Because what you eat affects your breast milk,  some of the foods may make your baby more irritable than usual. Avoid eating these foods if you are sure that they are negatively affecting your baby.  Drink milk, fruit juice, and water to satisfy your thirst (about 10 glasses a day).   Rest often, relax, and continue to take your prenatal vitamins to prevent fatigue, stress, and anemia.  Continue breast self-awareness checks.  Avoid chewing and smoking tobacco.  Avoid alcohol and drug use. Some medicines that may be harmful to your baby can pass through breast milk. It is important to ask your health care provider before taking any medicine, including all over-the-counter and prescription medicine as well as vitamin and herbal supplements. It is possible to become pregnant while breastfeeding. If birth control is desired, ask your health care provider about options that will be safe for your baby. SEEK MEDICAL CARE IF:   You feel like you want to stop breastfeeding or have become frustrated with breastfeeding.  You have painful breasts or nipples.  Your nipples are cracked or bleeding.  Your breasts are red, tender, or warm.  You have a swollen area on either breast.  You have a fever or chills.  You have nausea or vomiting.  You have drainage other than breast milk from your nipples.  Your breasts do not become full before feedings by the fifth day after you give birth.  You feel sad and depressed.  Your baby is too sleepy to eat well.  Your baby is having trouble sleeping.   Your baby is wetting less than 3 diapers in a 24-hour period.  Your baby has less than 3 stools in a 24-hour period.  Your baby's skin or the white part of his or her eyes becomes yellow.   Your baby is not gaining weight by 5 days of age. SEEK IMMEDIATE MEDICAL CARE IF:   Your baby is overly tired (lethargic) and does not want to wake up and feed.  Your baby develops an unexplained fever. Document Released: 06/02/2005 Document Revised:  06/07/2013 Document Reviewed: 11/24/2012 ExitCare Patient Information 2015 [employer]. This information is not intended to replace advice given to you by your health care provider. Make sure you discuss any questions you have with your health care provider.  

## 2014-06-26 NOTE — Progress Notes (Signed)
Here for first prenatal visit. [facility] for blood work and paperwork only. Used [employer].  Given new patient information.

## 2014-06-26 NOTE — Progress Notes (Signed)
Subjective:    Tracie LeedsBung Wynter is a [id number] 4571w0d being seen today for her first obstetrical visit.  Her obstetrical history is significant for advanced maternal age and h/o PTB adn neonatal demise x 3.  Pregnancy history fully reviewed.  Patient reports no complaints.  Filed Vitals:   06/26/14 1043 06/26/14 1046  BP: 107/72   Pulse: 75   Temp: 97.6 F (36.4 C)   Height:  5' (1.524 m)  Weight: 123 lb 6.4 oz (55.974 kg)     HISTORY: OB History  Gravida Para Term Preterm AB SAB TAB Ectopic Multiple Living  8 7 4 3  0 0 0   8    # Outcome Date GA Lbr Len/2nd Weight Sex Delivery Anes PTL Lv  8 Current           7 Term  841w0d   F Vag-Spont   Y  6 Term  3941w0d   F Vag-Spont   Y  5 Term  3241w0d   F Vag-Spont   Y  4 Term  1641w0d   M Vag-Spont   Y  3 Preterm  5935w0d   M Vag-Spont   ND  2 Preterm  6635w0d   F Vag-Spont   ND  1 Preterm  2462w0d   M Vag-Spont   ND    Obstetric Comments     Past Medical History  Diagnosis Date  . Medical history non-contributory   . Depression     in [place], everything is ok here   Past Surgical History  Procedure Laterality Date  . Hand surgery      lump removed right hand   Family History  Problem Relation Age of Onset  . Heart disease Neg Hx   . Diabetes Neg Hx      Exam    Uterus:   20 wk size  Pelvic Exam:    Perineum: Normal Perineum   Vulva: Bartholin's, Urethra, Skene's normal   Vagina:  normal mucosa, normal discharge   Cervix: multiparous appearance, no cervical motion tenderness and no lesions   Adnexa: normal adnexa   Bony Pelvis: average  System: Breast:  normal appearance, no masses or tenderness   Skin: normal coloration and turgor, no rashes    Neurologic: oriented   Extremities: normal strength, tone, and muscle mass   HEENT sclera clear, anicteric   Mouth/Teeth mucous membranes moist, pharynx normal without lesions and dental hygiene good   Neck supple   Cardiovascular: regular rate and rhythm, no murmurs or gallops    Respiratory:  appears well, vitals normal, no respiratory distress, acyanotic, normal RR, ear and throat exam is normal, neck free of mass or lymphadenopathy, chest clear, no wheezing, crepitations, rhonchi, normal symmetric air entry   Abdomen: soft, non-tender; bowel sounds normal; no masses,  no organomegaly      Assessment:    Pregnancy: [id number] Patient Active Problem List   Diagnosis Date Noted  . Supervision of high risk pregnancy in second trimester 06/13/2014    Priority: High  . Late prenatal care affecting pregnancy, antepartum 06/13/2014    Priority: Medium  . History of preterm delivery, currently pregnant in second trimester 06/13/2014    Priority: Medium  . Advanced maternal age, primigravida in second trimester, antepartum 06/13/2014    Priority: Medium  . Language barrier affecting health care 06/13/2014        Plan:     Initial labs drawn. Prenatal vitamins. Problem list reviewed and updated. Genetic Screening discussed First Screen:  too late.  Ultrasound discussed; fetal survey: results reviewed.  Follow up in 2 weeks. Problem List Items Addressed This Visit      High   Supervision of high risk pregnancy in second trimester   Relevant Orders      Prescript Monitor Profile(19)      Cytology - PAP     Medium   History of preterm delivery, currently pregnant in second trimester - Primary   Relevant Medications      hydroxyprogesterone caproate (MAKENA) 250 mg/mL OIL injection   Advanced maternal age, primigravida in second trimester, antepartum   Relevant Orders      AMB referral to maternal fetal medicine        Zen Cedillos S 06/26/2014

## 2014-06-26 NOTE — Telephone Encounter (Signed)
Received call from Nash DimmerKerry at [facility] stating they are faxing RX for 17P to [facility] and that they will be the pharmacy filling the medication.

## 2014-06-26 NOTE — Progress Notes (Signed)
Nutrition note: 1st visit consult Pt has gained 23.4# @ 5765w0d, which is > expected. Pt reports eating 3 meals & 0-1 snack/d. Pt is taking a PNV. Pt reports no N/V or heartburn.  Pt received verbal & written education on general nutrition during pregnancy. Discussed wt gain goals of 25-35# or 1#/wk. Pt agrees to continue taking a PNV. Pt has WIC & plans to BF. F/u as needed Blondell RevealLaura Theia Dezeeuw, MS, RD, LDN, [facility]

## 2014-06-26 NOTE — Progress Notes (Signed)
Makena application filled out [facility] along with insurance card information. ROI signed and faxed to [facility] to obtain prental labs.

## 2014-06-27 LAB — CYTOLOGY - PAP

## 2014-06-28 ENCOUNTER — Ambulatory Visit (INDEPENDENT_AMBULATORY_CARE_PROVIDER_SITE_OTHER): Payer: Medicaid Other | Admitting: *Deleted

## 2014-06-28 ENCOUNTER — Telehealth: Payer: Self-pay | Admitting: *Deleted

## 2014-06-28 VITALS — BP 114/77 | HR 80 | Temp 97.9°F

## 2014-06-28 DIAGNOSIS — O09892 Supervision of other high risk pregnancies, second trimester: Secondary | ICD-10-CM

## 2014-06-28 DIAGNOSIS — O09212 Supervision of pregnancy with history of pre-term labor, second trimester: Secondary | ICD-10-CM

## 2014-06-28 LAB — PRESCRIPTION MONITORING PROFILE (19 PANEL)
Amphetamine/Meth: NEGATIVE ng/mL
Barbiturate Screen, Urine: NEGATIVE ng/mL
Benzodiazepine Screen, Urine: NEGATIVE ng/mL
Buprenorphine, Urine: NEGATIVE ng/mL
CANNABINOID SCRN UR: NEGATIVE ng/mL
CARISOPRODOL, URINE: NEGATIVE ng/mL
COCAINE METABOLITES: NEGATIVE ng/mL
Creatinine, Urine: 65.27 mg/dL (ref >20.0)
Fentanyl, Ur: NEGATIVE ng/mL
MDMA URINE: NEGATIVE ng/mL
Meperidine, Ur: NEGATIVE ng/mL
Methadone Screen, Urine: NEGATIVE ng/mL
Methaqualone: NEGATIVE ng/mL
Nitrites, Initial: NEGATIVE ug/mL
OXYCODONE SCRN UR: NEGATIVE ng/mL
Opiate Screen, Urine: NEGATIVE ng/mL
Phencyclidine, Ur: NEGATIVE ng/mL
Propoxyphene: NEGATIVE ng/mL
Tapentadol, urine: NEGATIVE ng/mL
Tramadol Scrn, Ur: NEGATIVE ng/mL
Zolpidem, Urine: NEGATIVE ng/mL
pH, Initial: 6.7 pH (ref 4.5–8.9)

## 2014-06-28 LAB — POCT URINALYSIS DIP (DEVICE)
BILIRUBIN URINE: NEGATIVE
Glucose, UA: NEGATIVE mg/dL
HGB URINE DIPSTICK: NEGATIVE
Ketones, ur: NEGATIVE mg/dL
Leukocytes, UA: NEGATIVE
NITRITE: NEGATIVE
PH: 7 (ref 5.0–8.0)
Protein, ur: NEGATIVE mg/dL
Specific Gravity, Urine: 1.02 (ref 1.005–1.030)
Urobilinogen, UA: 0.2 mg/dL (ref 0.0–1.0)

## 2014-06-28 MED ORDER — HYDROXYPROGESTERONE CAPROATE 250 MG/ML IM OIL
250.0000 mg | TOPICAL_OIL | Freq: Once | INTRAMUSCULAR | Status: AC
  Administered 2014-06-28: 250 mg via INTRAMUSCULAR

## 2014-06-28 NOTE — Telephone Encounter (Signed)
Tracie Tucker came to clinic and got her shot with her emergency contact Vung.

## 2014-06-28 NOTE — Progress Notes (Signed)
Patient brought to clinic for appointment as requested.  Starting 17P injections today. Emergency contact Vung with her. Explained to her will give first injection today, will get next one Monday 07/03/14 when here for next ob visit and then weekly until 36 weeks.

## 2014-06-28 NOTE — Telephone Encounter (Signed)
17P arrived fro [place] [facility]. [employer]- they did not have anyone on staff to interpret for [name]. Called Candela and was told she wasn't there- spoke with her emergency contact Vung. Left message with her we need to speak with Savon about making an appointment for today, otherwise keep appointment for Monday. Explained will need to see her Monday, but would like to see her today if possible.  Vung said she will drive by her house and talk with her and call us back in an hour or so.  Needs to be told to come in for 17P today if possible, otherwise can just start Monday.

## 2014-06-30 ENCOUNTER — Encounter: Payer: Self-pay | Admitting: *Deleted

## 2014-07-03 ENCOUNTER — Encounter: Payer: Self-pay | Admitting: Obstetrics and Gynecology

## 2014-07-03 ENCOUNTER — Ambulatory Visit (INDEPENDENT_AMBULATORY_CARE_PROVIDER_SITE_OTHER): Payer: Medicaid Other | Admitting: Obstetrics and Gynecology

## 2014-07-03 VITALS — BP 107/62 | HR 70 | Temp 98.7°F | Wt 126.3 lb

## 2014-07-03 DIAGNOSIS — O09212 Supervision of pregnancy with history of pre-term labor, second trimester: Secondary | ICD-10-CM

## 2014-07-03 LAB — POCT URINALYSIS DIP (DEVICE)
BILIRUBIN URINE: NEGATIVE
GLUCOSE, UA: NEGATIVE mg/dL
Hgb urine dipstick: NEGATIVE
KETONES UR: NEGATIVE mg/dL
Leukocytes, UA: NEGATIVE
NITRITE: NEGATIVE
PROTEIN: NEGATIVE mg/dL
Specific Gravity, Urine: 1.02 (ref 1.005–1.030)
Urobilinogen, UA: 0.2 mg/dL (ref 0.0–1.0)
pH: 7 (ref 5.0–8.0)

## 2014-07-03 MED ORDER — HYDROXYPROGESTERONE CAPROATE 250 MG/ML IM OIL
250.0000 mg | TOPICAL_OIL | INTRAMUSCULAR | Status: DC
  Administered 2014-07-03 – 2014-07-17 (×3): 250 mg via INTRAMUSCULAR

## 2014-07-03 NOTE — Addendum Note (Signed)
Addended by: Candelaria StagersHAIZLIP, Brittannie Tawney E on: 07/03/2014 08:53 AM   Modules accepted: Orders

## 2014-07-03 NOTE — Progress Notes (Signed)
Pt presents for regular OB appt.  + FM, no LOF, no contractions.  Given h/o preterm labor will get 17-p.   RTC 1 wk for 17-p, 3-4 wks for HROB

## 2014-07-03 NOTE — Progress Notes (Signed)
Jamelle HaringSnow used as interpreter for this encounter.

## 2014-07-10 ENCOUNTER — Ambulatory Visit (INDEPENDENT_AMBULATORY_CARE_PROVIDER_SITE_OTHER): Payer: Medicaid Other | Admitting: *Deleted

## 2014-07-10 VITALS — BP 107/70 | HR 71 | Temp 98.4°F

## 2014-07-10 DIAGNOSIS — O09892 Supervision of other high risk pregnancies, second trimester: Secondary | ICD-10-CM

## 2014-07-10 DIAGNOSIS — O09212 Supervision of pregnancy with history of pre-term labor, second trimester: Secondary | ICD-10-CM

## 2014-07-17 ENCOUNTER — Ambulatory Visit (INDEPENDENT_AMBULATORY_CARE_PROVIDER_SITE_OTHER): Payer: Medicaid Other | Admitting: *Deleted

## 2014-07-17 VITALS — BP 108/71 | HR 86 | Temp 98.2°F

## 2014-07-17 DIAGNOSIS — O09892 Supervision of other high risk pregnancies, second trimester: Secondary | ICD-10-CM

## 2014-07-17 DIAGNOSIS — O09212 Supervision of pregnancy with history of pre-term labor, second trimester: Secondary | ICD-10-CM

## 2014-07-24 ENCOUNTER — Encounter: Payer: Self-pay | Admitting: Obstetrics and Gynecology

## 2014-07-24 ENCOUNTER — Ambulatory Visit (INDEPENDENT_AMBULATORY_CARE_PROVIDER_SITE_OTHER): Payer: Medicaid Other | Admitting: Obstetrics and Gynecology

## 2014-07-24 VITALS — BP 119/84 | HR 81 | Wt 129.7 lb

## 2014-07-24 DIAGNOSIS — O0932 Supervision of pregnancy with insufficient antenatal care, second trimester: Secondary | ICD-10-CM

## 2014-07-24 DIAGNOSIS — O09512 Supervision of elderly primigravida, second trimester: Secondary | ICD-10-CM

## 2014-07-24 DIAGNOSIS — O09892 Supervision of other high risk pregnancies, second trimester: Secondary | ICD-10-CM

## 2014-07-24 DIAGNOSIS — Z789 Other specified health status: Secondary | ICD-10-CM

## 2014-07-24 DIAGNOSIS — O0992 Supervision of high risk pregnancy, unspecified, second trimester: Secondary | ICD-10-CM

## 2014-07-24 DIAGNOSIS — Z23 Encounter for immunization: Secondary | ICD-10-CM

## 2014-07-24 DIAGNOSIS — O09212 Supervision of pregnancy with history of pre-term labor, second trimester: Secondary | ICD-10-CM

## 2014-07-24 LAB — POCT URINALYSIS DIP (DEVICE)
Bilirubin Urine: NEGATIVE
Glucose, UA: NEGATIVE mg/dL
Hgb urine dipstick: NEGATIVE
KETONES UR: NEGATIVE mg/dL
Leukocytes, UA: NEGATIVE
Nitrite: NEGATIVE
PROTEIN: NEGATIVE mg/dL
SPECIFIC GRAVITY, URINE: 1.015 (ref 1.005–1.030)
Urobilinogen, UA: 0.2 mg/dL (ref 0.0–1.0)
pH: 7 (ref 5.0–8.0)

## 2014-07-24 MED ORDER — TETANUS-DIPHTH-ACELL PERTUSSIS 5-2.5-18.5 LF-MCG/0.5 IM SUSP
0.5000 mL | Freq: Once | INTRAMUSCULAR | Status: AC
  Administered 2014-07-24: 0.5 mL via INTRAMUSCULAR

## 2014-07-24 NOTE — Progress Notes (Signed)
Patient is doing well without complaints. FM/PTL precautions reviewed. Continue weekly 17-P. 1hr GCT and labs today. Patient undecided on contraception

## 2014-07-24 NOTE — Progress Notes (Signed)
Snow used for interpreter; reports pelvic pressure with extended sitting

## 2014-07-25 LAB — CBC
HEMATOCRIT: 33.2 % — AB (ref 36.0–46.0)
Hemoglobin: 10.4 g/dL — ABNORMAL LOW (ref 12.0–15.0)
MCH: 29 pg (ref 26.0–34.0)
MCHC: 31.3 g/dL (ref 30.0–36.0)
MCV: 92.5 fL (ref 78.0–100.0)
MPV: 10 fL (ref 8.6–12.4)
Platelets: 355 10*3/uL (ref 150–400)
RBC: 3.59 MIL/uL — ABNORMAL LOW (ref 3.87–5.11)
RDW: 13.6 % (ref 11.5–15.5)
WBC: 11.2 10*3/uL — AB (ref 4.0–10.5)

## 2014-07-25 LAB — HIV ANTIBODY (ROUTINE TESTING W REFLEX): HIV: NONREACTIVE

## 2014-07-25 LAB — RPR

## 2014-07-25 LAB — GLUCOSE TOLERANCE, 1 HOUR (50G) W/O FASTING: Glucose, 1 Hour GTT: 121 mg/dL (ref 70–140)

## 2014-07-31 ENCOUNTER — Ambulatory Visit: Payer: Medicaid Other

## 2014-08-02 ENCOUNTER — Ambulatory Visit (INDEPENDENT_AMBULATORY_CARE_PROVIDER_SITE_OTHER): Payer: Medicaid Other | Admitting: General Practice

## 2014-08-02 VITALS — BP 106/88 | HR 75 | Ht 60.0 in | Wt 131.2 lb

## 2014-08-02 DIAGNOSIS — O09212 Supervision of pregnancy with history of pre-term labor, second trimester: Secondary | ICD-10-CM

## 2014-08-02 MED ORDER — HYDROXYPROGESTERONE CAPROATE 250 MG/ML IM OIL
250.0000 mg | TOPICAL_OIL | INTRAMUSCULAR | Status: DC
  Administered 2014-08-02 – 2014-09-18 (×7): 250 mg via INTRAMUSCULAR

## 2014-08-07 ENCOUNTER — Ambulatory Visit (INDEPENDENT_AMBULATORY_CARE_PROVIDER_SITE_OTHER): Payer: Medicaid Other | Admitting: Obstetrics and Gynecology

## 2014-08-07 VITALS — BP 126/79 | HR 78 | Temp 97.6°F | Wt 133.0 lb

## 2014-08-07 DIAGNOSIS — O09512 Supervision of elderly primigravida, second trimester: Secondary | ICD-10-CM

## 2014-08-07 DIAGNOSIS — O09892 Supervision of other high risk pregnancies, second trimester: Secondary | ICD-10-CM

## 2014-08-07 DIAGNOSIS — O09212 Supervision of pregnancy with history of pre-term labor, second trimester: Secondary | ICD-10-CM

## 2014-08-07 DIAGNOSIS — O0992 Supervision of high risk pregnancy, unspecified, second trimester: Secondary | ICD-10-CM

## 2014-08-07 LAB — POCT URINALYSIS DIP (DEVICE)
Bilirubin Urine: NEGATIVE
GLUCOSE, UA: NEGATIVE mg/dL
Hgb urine dipstick: NEGATIVE
Ketones, ur: NEGATIVE mg/dL
NITRITE: NEGATIVE
PROTEIN: NEGATIVE mg/dL
Specific Gravity, Urine: 1.015 (ref 1.005–1.030)
Urobilinogen, UA: 0.2 mg/dL (ref 0.0–1.0)
pH: 7 (ref 5.0–8.0)

## 2014-08-07 NOTE — Progress Notes (Signed)
Interpreter present for encounter.  

## 2014-08-07 NOTE — Progress Notes (Signed)
Patient has not tested glucose since she was here on 07/24/14. Her husband went to pharmacy and was told Medicaid would not cover. I spoke with pharmacist and resolved confusion. I also reinstructed patient on testing procedure. Glucose tested this date with result of 78mg /dl. Interpreter [employer]ek Siu from [employer] present.

## 2014-08-07 NOTE — Progress Notes (Signed)
[id number] at 4328w0d. Interpreter used for visit. Doing well today. No concerns or complaints. +FM. Denies VB, LOF, uterine contractions.  1. History of preterm delivery. Continue weekly 17 P injections 2. Routine PNC. Lab work reviewed. 28 week labs completed at last visit, 1 hour GCT 121. FM/PTL precautions reviewed.

## 2014-08-21 ENCOUNTER — Ambulatory Visit (INDEPENDENT_AMBULATORY_CARE_PROVIDER_SITE_OTHER): Payer: Medicaid Other | Admitting: Obstetrics and Gynecology

## 2014-08-21 VITALS — BP 113/77 | HR 70 | Temp 98.0°F | Wt 133.9 lb

## 2014-08-21 DIAGNOSIS — O09212 Supervision of pregnancy with history of pre-term labor, second trimester: Secondary | ICD-10-CM

## 2014-08-21 DIAGNOSIS — R350 Frequency of micturition: Secondary | ICD-10-CM

## 2014-08-21 DIAGNOSIS — O09892 Supervision of other high risk pregnancies, second trimester: Secondary | ICD-10-CM

## 2014-08-21 DIAGNOSIS — O09512 Supervision of elderly primigravida, second trimester: Secondary | ICD-10-CM

## 2014-08-21 DIAGNOSIS — O0992 Supervision of high risk pregnancy, unspecified, second trimester: Secondary | ICD-10-CM

## 2014-08-21 LAB — POCT URINALYSIS DIP (DEVICE)
Bilirubin Urine: NEGATIVE
Glucose, UA: NEGATIVE mg/dL
HGB URINE DIPSTICK: NEGATIVE
Ketones, ur: NEGATIVE mg/dL
Nitrite: NEGATIVE
Protein, ur: NEGATIVE mg/dL
SPECIFIC GRAVITY, URINE: 1.015 (ref 1.005–1.030)
Urobilinogen, UA: 0.2 mg/dL (ref 0.0–1.0)
pH: 7 (ref 5.0–8.0)

## 2014-08-21 NOTE — Progress Notes (Signed)
44 y.o. [id number] at 5939w0d here for routine OB visit. Interpreter used for today's visit. Doing well today.  1. History of preterm delivery. Continue weekly 17P injections.  2. Urinary frequency. Urinalysis unremarkable Urine culture today.  3. Routine PNC. Lab work reviewed. FM/PTL precautions reviewed. RTC for provider visit in 2 weeks.

## 2014-08-21 NOTE — Progress Notes (Signed)
17P Pt complains of burning and frequency of urination for the past 2 days. States  Little drop of  Urine Interpreter present for encounter

## 2014-08-22 LAB — URINE CULTURE: Colony Count: >=100000

## 2014-08-28 ENCOUNTER — Ambulatory Visit (INDEPENDENT_AMBULATORY_CARE_PROVIDER_SITE_OTHER): Payer: Medicaid Other | Admitting: *Deleted

## 2014-08-28 VITALS — BP 111/76 | HR 71 | Temp 98.2°F | Wt 134.8 lb

## 2014-08-28 DIAGNOSIS — O09212 Supervision of pregnancy with history of pre-term labor, second trimester: Secondary | ICD-10-CM

## 2014-08-28 DIAGNOSIS — O09892 Supervision of other high risk pregnancies, second trimester: Secondary | ICD-10-CM

## 2014-09-04 ENCOUNTER — Ambulatory Visit (INDEPENDENT_AMBULATORY_CARE_PROVIDER_SITE_OTHER): Payer: Medicaid Other | Admitting: Obstetrics & Gynecology

## 2014-09-04 VITALS — BP 129/85 | HR 81 | Temp 97.8°F | Wt 135.3 lb

## 2014-09-04 DIAGNOSIS — N898 Other specified noninflammatory disorders of vagina: Secondary | ICD-10-CM

## 2014-09-04 DIAGNOSIS — O26893 Other specified pregnancy related conditions, third trimester: Secondary | ICD-10-CM

## 2014-09-04 DIAGNOSIS — O09892 Supervision of other high risk pregnancies, second trimester: Secondary | ICD-10-CM

## 2014-09-04 DIAGNOSIS — O09213 Supervision of pregnancy with history of pre-term labor, third trimester: Secondary | ICD-10-CM

## 2014-09-04 DIAGNOSIS — O0992 Supervision of high risk pregnancy, unspecified, second trimester: Secondary | ICD-10-CM

## 2014-09-04 DIAGNOSIS — O09212 Supervision of pregnancy with history of pre-term labor, second trimester: Principal | ICD-10-CM

## 2014-09-04 DIAGNOSIS — O0993 Supervision of high risk pregnancy, unspecified, third trimester: Secondary | ICD-10-CM

## 2014-09-04 LAB — POCT URINALYSIS DIP (DEVICE)
Bilirubin Urine: NEGATIVE
Glucose, UA: NEGATIVE mg/dL
HGB URINE DIPSTICK: NEGATIVE
Ketones, ur: NEGATIVE mg/dL
Leukocytes, UA: NEGATIVE
Nitrite: NEGATIVE
PROTEIN: NEGATIVE mg/dL
SPECIFIC GRAVITY, URINE: 1.02 (ref 1.005–1.030)
UROBILINOGEN UA: 0.2 mg/dL (ref 0.0–1.0)
pH: 7 (ref 5.0–8.0)

## 2014-09-04 MED ORDER — PRENATAL PLUS 27-1 MG PO TABS: 1.0000 | ORAL_TABLET | Freq: Every day | ORAL | Status: DC

## 2014-09-04 MED ORDER — FLUCONAZOLE 150 MG PO TABS: 150.0000 mg | ORAL_TABLET | Freq: Once | ORAL | Status: DC

## 2014-09-04 NOTE — Progress Notes (Signed)
New Refill ordered from compounding pharmacy

## 2014-09-04 NOTE — Progress Notes (Signed)
Interpreter present Wet prep done today for pruritic vaginal discharge, will follow up results.  Diflucan presumptively prescribed. Continue weekly 17P.  Labor and fetal movement precautions reviewed.

## 2014-09-04 NOTE — Patient Instructions (Signed)
Return to clinic for any obstetric concerns or go to MAU for evaluation  

## 2014-09-04 NOTE — Progress Notes (Signed)
Requests refill PNV.  C/o of white vaginal discharge with itching for the last 5 days-- wet prep today.  C/o pelvic pressure when baby is moving. H'lus Ksor used as interpreter for this encounter.

## 2014-09-05 LAB — WET PREP, GENITAL
CLUE CELLS WET PREP: NONE SEEN
Trich, Wet Prep: NONE SEEN
WBC, Wet Prep HPF POC: NONE SEEN

## 2014-09-11 ENCOUNTER — Ambulatory Visit (INDEPENDENT_AMBULATORY_CARE_PROVIDER_SITE_OTHER): Payer: Medicaid Other | Admitting: *Deleted

## 2014-09-11 DIAGNOSIS — O09213 Supervision of pregnancy with history of pre-term labor, third trimester: Secondary | ICD-10-CM

## 2014-09-11 DIAGNOSIS — O09893 Supervision of other high risk pregnancies, third trimester: Secondary | ICD-10-CM

## 2014-09-11 NOTE — Progress Notes (Signed)
Pt here for 17P only.

## 2014-09-18 ENCOUNTER — Ambulatory Visit (INDEPENDENT_AMBULATORY_CARE_PROVIDER_SITE_OTHER): Payer: Medicaid Other | Admitting: Obstetrics and Gynecology

## 2014-09-18 VITALS — BP 124/80 | HR 68 | Wt 139.9 lb

## 2014-09-18 DIAGNOSIS — O0992 Supervision of high risk pregnancy, unspecified, second trimester: Secondary | ICD-10-CM

## 2014-09-18 DIAGNOSIS — O09512 Supervision of elderly primigravida, second trimester: Secondary | ICD-10-CM

## 2014-09-18 DIAGNOSIS — O26843 Uterine size-date discrepancy, third trimester: Secondary | ICD-10-CM

## 2014-09-18 DIAGNOSIS — O09212 Supervision of pregnancy with history of pre-term labor, second trimester: Secondary | ICD-10-CM

## 2014-09-18 DIAGNOSIS — O09892 Supervision of other high risk pregnancies, second trimester: Secondary | ICD-10-CM

## 2014-09-18 DIAGNOSIS — Z789 Other specified health status: Secondary | ICD-10-CM

## 2014-09-18 LAB — POCT URINALYSIS DIP (DEVICE)
Bilirubin Urine: NEGATIVE
Glucose, UA: NEGATIVE mg/dL
Hgb urine dipstick: NEGATIVE
Ketones, ur: NEGATIVE mg/dL
Leukocytes, UA: NEGATIVE
NITRITE: NEGATIVE
PROTEIN: NEGATIVE mg/dL
Specific Gravity, Urine: 1.02 (ref 1.005–1.030)
UROBILINOGEN UA: 0.2 mg/dL (ref 0.0–1.0)
pH: 7 (ref 5.0–8.0)

## 2014-09-18 NOTE — Progress Notes (Signed)
Interpreter present for check in

## 2014-09-18 NOTE — Progress Notes (Signed)
44 y.o. [id number] at 971w0d here for routine OB visit. Interpreter used for today's visit. Doing well today.  1. History of preterm delivery. Continue weekly 17P injections through 36 weeks.  2. Size < Dates. No change in fundal height in the last 2 weeks. Will schedule growth scan for this week.  3. Routine PNC. Lab work reviewed. FM/PTL precautions reviewed. Vaginal swabs done at 36 weeks. RTC in 1 week for 17P, 2 weeks for provider visit.

## 2014-09-25 ENCOUNTER — Ambulatory Visit (INDEPENDENT_AMBULATORY_CARE_PROVIDER_SITE_OTHER): Payer: Medicaid Other | Admitting: General Practice

## 2014-09-25 ENCOUNTER — Ambulatory Visit (HOSPITAL_COMMUNITY)
Admission: RE | Admit: 2014-09-25 | Discharge: 2014-09-25 | Disposition: A | Discharge status: Home / Self Care | Payer: Medicaid Other | Source: Ambulatory Visit | Attending: Obstetrics and Gynecology | Admitting: Obstetrics and Gynecology

## 2014-09-25 VITALS — BP 111/74 | HR 71 | Temp 98.0°F | Ht 60.0 in | Wt 138.7 lb

## 2014-09-25 DIAGNOSIS — O26849 Uterine size-date discrepancy, unspecified trimester: Secondary | ICD-10-CM | POA: Insufficient documentation

## 2014-09-25 DIAGNOSIS — O09213 Supervision of pregnancy with history of pre-term labor, third trimester: Secondary | ICD-10-CM

## 2014-09-25 DIAGNOSIS — Z3A35 35 weeks gestation of pregnancy: Secondary | ICD-10-CM | POA: Diagnosis not present

## 2014-09-25 DIAGNOSIS — O0933 Supervision of pregnancy with insufficient antenatal care, third trimester: Secondary | ICD-10-CM | POA: Insufficient documentation

## 2014-09-25 DIAGNOSIS — Z36 Encounter for antenatal screening of mother: Secondary | ICD-10-CM | POA: Diagnosis not present

## 2014-09-25 DIAGNOSIS — O26843 Uterine size-date discrepancy, third trimester: Secondary | ICD-10-CM

## 2014-09-25 DIAGNOSIS — O09893 Supervision of other high risk pregnancies, third trimester: Secondary | ICD-10-CM

## 2014-09-25 DIAGNOSIS — O09523 Supervision of elderly multigravida, third trimester: Secondary | ICD-10-CM | POA: Insufficient documentation

## 2014-09-25 IMAGING — US US OB FOLLOW-UP
1 series · 12 of 28 positions shown · non-contrast
Comparison: none

[Series 1: us ob follow up · 52 acquisitions, 12 frames shown]
[im 2/52]
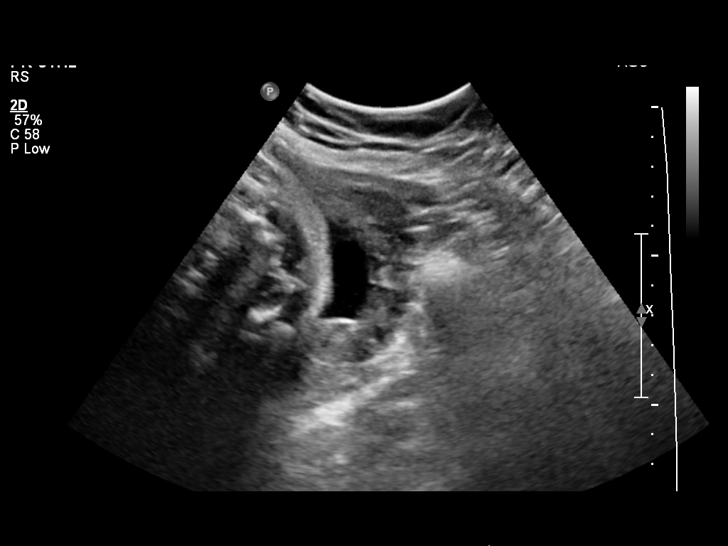
[im 6/52]
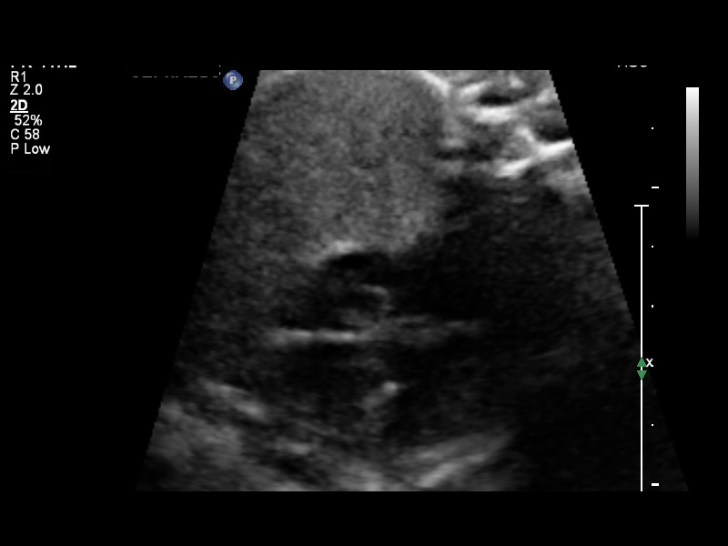
[im 10/52]
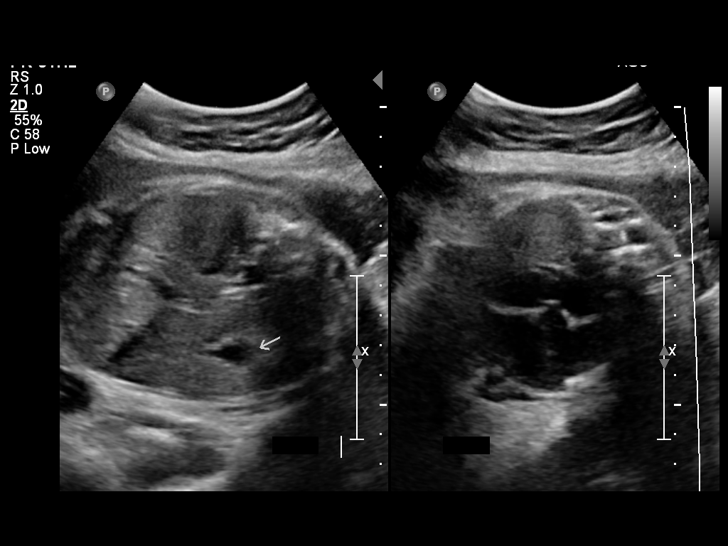
[im 16/52]
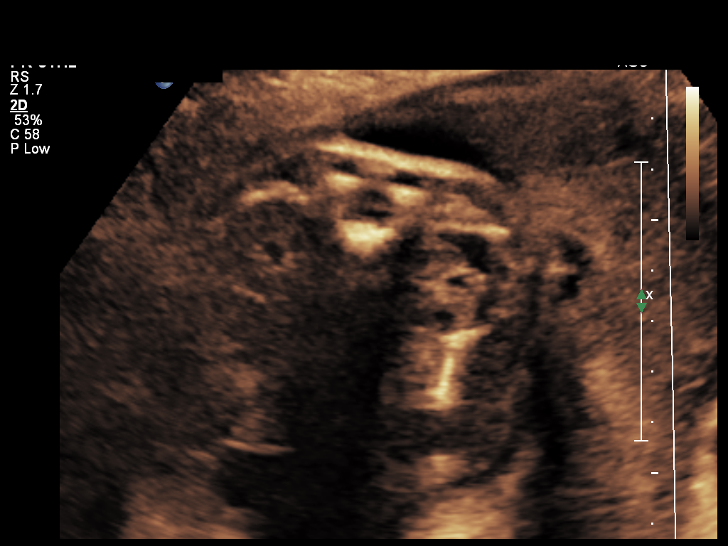
[im 19/52]
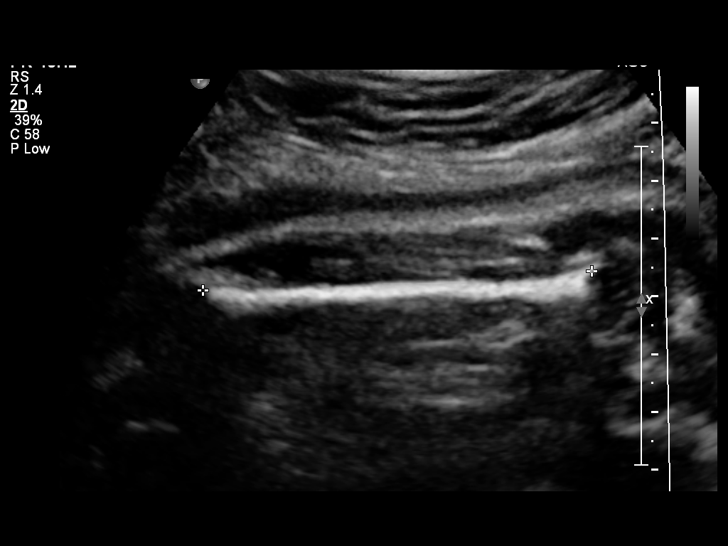
[im 23/52]
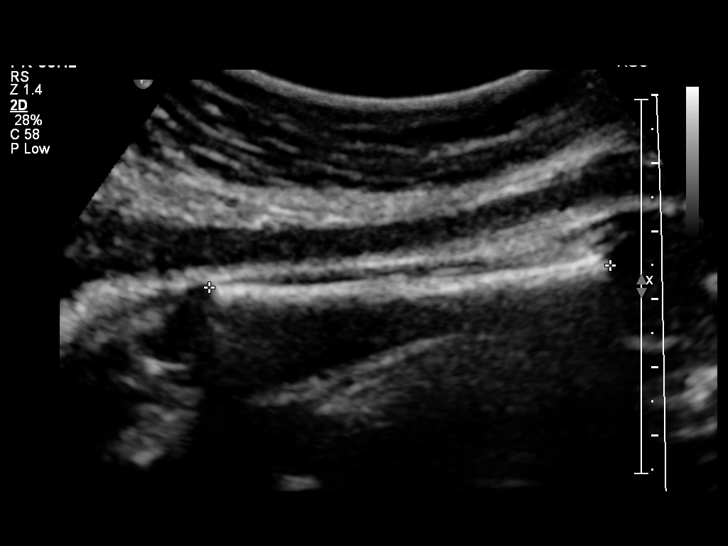
[im 29/52]
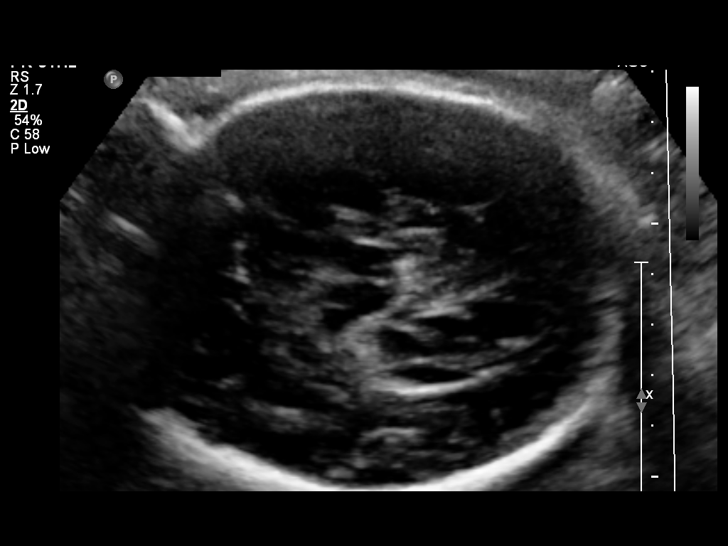
[im 33/52]
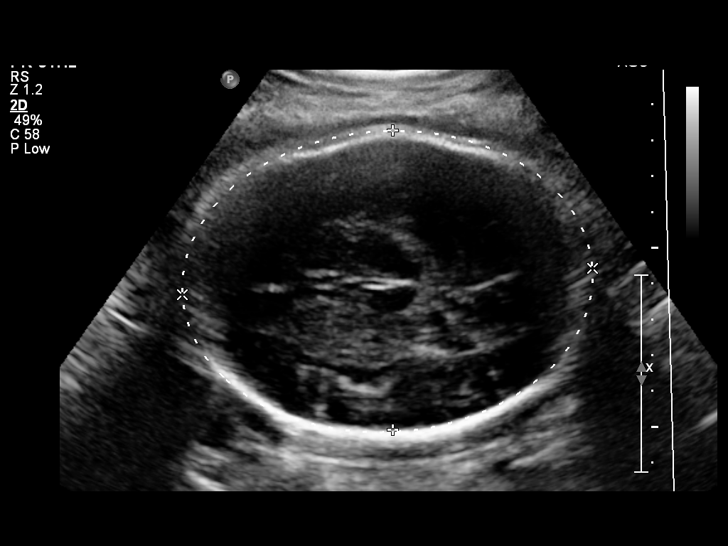
[im 36/52]
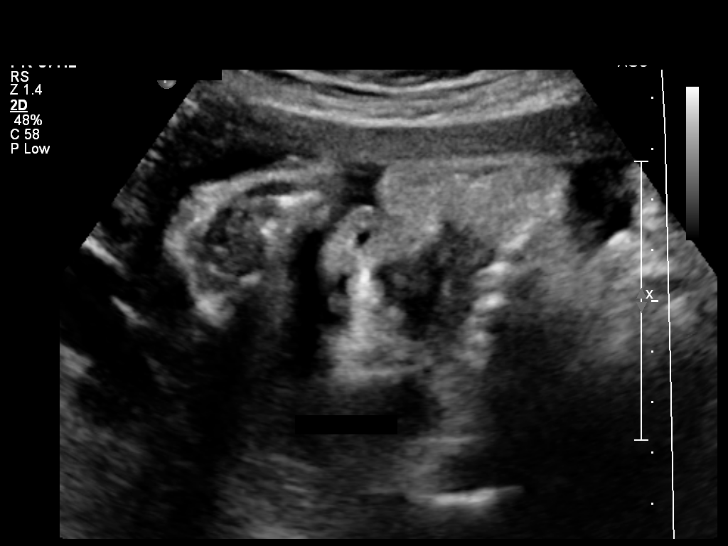
[im 42/52]
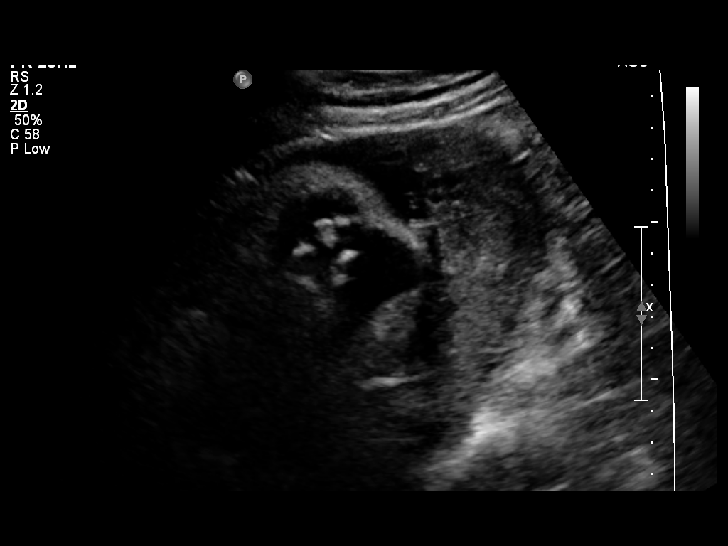
[im 46/52]
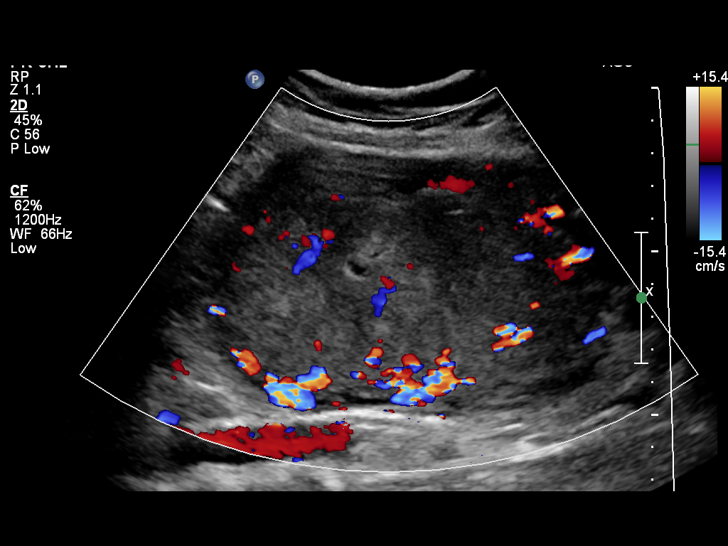
[im 50/52]
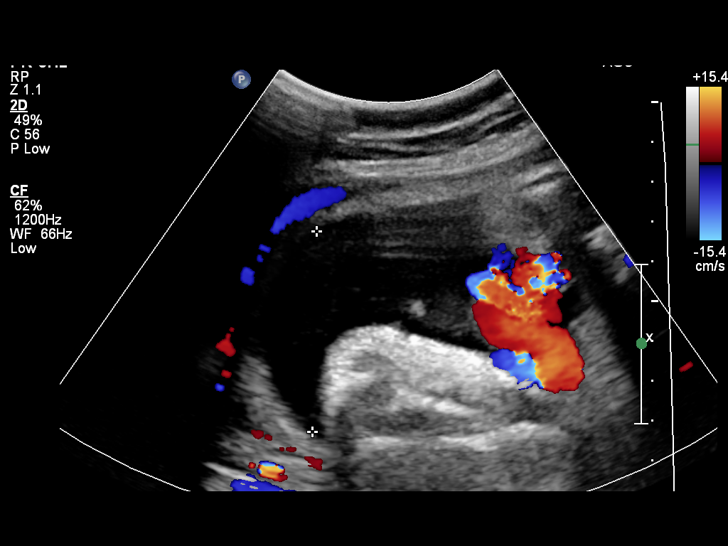

[12 of 28 positions shown; findings below may reference images not displayed]

OBSTETRICS REPORT
(Signed Final [DATE] [DATE])

Service(s) Provided

US OB FOLLOW UP                                       76816.1
Indications

No or Little Prenatal Care                            [N3]
Poor obstetric history: Previous preterm deliveries   [N3]
Poor obstetrical history (3 preterm demises)          [N3]
Advanced maternal age multigravida (43), second       [N3]
trimester
35 weeks gestation of pregnancy
Fetal Evaluation

Num Of Fetuses:    1
Fetal Heart Rate:  139                          bpm
Cardiac Activity:  Observed
Presentation:      Cephalic
Placenta:          Posterior, above cervical
os
P. Cord            Previously Visualized
Insertion:

Amniotic Fluid
AFI FV:      Subjectively within normal limits
AFI Sum:     12.39   cm       38  %Tile     Larg Pckt:    5.01  cm
RUQ:   3.64    cm   RLQ:    5.01   cm    LUQ:   2.47    cm   LLQ:    1.27   cm
Biometry

BPD:       83  mm     G. Age:  33w 3d                CI:         70.9   70 - 86
FL/HC:      21.3   20.1 -
22.3
HC:     314.1  mm     G. Age:  35w 1d       22  %    HC/AC:      1.06   0.93 -
1.11
AC:     297.1  mm     G. Age:  33w 5d       21  %    FL/BPD:     80.6   71 - 87
FL:      66.9  mm     G. Age:  34w 3d       28  %    FL/AC:      22.5   20 - 24
HUM:     58.6  mm     G. Age:  33w 6d       46  %

Est. FW:    [N3]  gm      5 lb 2 oz     42  %
Gestational Age

LMP:           35w 0d        Date:  [DATE]                 EDD:   [DATE]
U/S Today:     34w 1d                                        EDD:   [DATE]
Best:          35w 0d     Det. By:  LMP  ([DATE])          EDD:   [DATE]
Anatomy

Cranium:          Previously seen        Aortic Arch:      Previously seen
Fetal Cavum:      Previously seen        Ductal Arch:      Previously seen
Ventricles:       Appears normal         Diaphragm:        Previously seen
Choroid Plexus:   Previously seen        Stomach:          Appears normal, left
sided
Cerebellum:       Previously seen        Abdomen:          Previously seen
Posterior Fossa:  Previously seen        Abdominal Wall:   Previously seen
Nuchal Fold:      Not applicable (>20    Cord Vessels:     Appears normal (3
wks GA)                                  vessel cord)
Face:             Orbits and profile     Kidneys:          Appear normal
previously seen
Lips:             Previously seen        Bladder:          Appears normal
Heart:            Echogenic focus        Spine:            Previously seen
in LV
RVOT:             Previously seen        Lower             Previously seen
Extremities:
LVOT:             Previously seen        Upper             Previously seen
Extremities:

Other:  Heels and 5th digit previously visualized. Fetus appears to be a male.
Nasal bone previously visualized. Technically difficult due to
advanced GA.
Targeted Anatomy

Fetal Central Nervous System
Lat. Ventricles:
Cervix Uterus Adnexa

Cervix:       Not visualized (advanced GA >[N3])
Uterus:       No abnormality visualized.

Left Ovary:    Not visualized.
Right Ovary:   Not visualized.
Adnexa:     No abnormality visualized. No adnexal mass visualized.
Impression

Single IUP at 35w 0d
Echogenic intracardiac focus noted in the left ventricle
Interval fetal growth is appropriate (42nd %tile)
Normal amniotic fluid volume
Recommendations

Follow-up ultrasounds as clinically indicated.

Thank you for sharing in the care of Ms. DETERS with
questions or concerns.

## 2014-09-25 MED ORDER — HYDROXYPROGESTERONE CAPROATE 250 MG/ML IM OIL
250.0000 mg | TOPICAL_OIL | Freq: Once | INTRAMUSCULAR | Status: AC
  Administered 2014-09-25: 250 mg via INTRAMUSCULAR

## 2014-10-02 ENCOUNTER — Ambulatory Visit (INDEPENDENT_AMBULATORY_CARE_PROVIDER_SITE_OTHER): Payer: Medicaid Other | Admitting: Obstetrics & Gynecology

## 2014-10-02 ENCOUNTER — Ambulatory Visit (HOSPITAL_COMMUNITY): Payer: Medicaid Other

## 2014-10-02 ENCOUNTER — Other Ambulatory Visit: Payer: Self-pay | Admitting: Obstetrics & Gynecology

## 2014-10-02 VITALS — BP 124/83 | HR 66 | Temp 97.9°F | Wt 141.2 lb

## 2014-10-02 DIAGNOSIS — L298 Other pruritus: Secondary | ICD-10-CM

## 2014-10-02 DIAGNOSIS — O09213 Supervision of pregnancy with history of pre-term labor, third trimester: Secondary | ICD-10-CM

## 2014-10-02 DIAGNOSIS — N898 Other specified noninflammatory disorders of vagina: Secondary | ICD-10-CM

## 2014-10-02 DIAGNOSIS — O0993 Supervision of high risk pregnancy, unspecified, third trimester: Secondary | ICD-10-CM

## 2014-10-02 DIAGNOSIS — O09893 Supervision of other high risk pregnancies, third trimester: Secondary | ICD-10-CM

## 2014-10-02 LAB — POCT URINALYSIS DIP (DEVICE)
BILIRUBIN URINE: NEGATIVE
GLUCOSE, UA: NEGATIVE mg/dL
HGB URINE DIPSTICK: NEGATIVE
KETONES UR: NEGATIVE mg/dL
Leukocytes, UA: NEGATIVE
Nitrite: NEGATIVE
Protein, ur: NEGATIVE mg/dL
Specific Gravity, Urine: 1.02 (ref 1.005–1.030)
UROBILINOGEN UA: 0.2 mg/dL (ref 0.0–1.0)
pH: 7 (ref 5.0–8.0)

## 2014-10-02 MED ORDER — HYDROXYPROGESTERONE CAPROATE 250 MG/ML IM OIL
250.0000 mg | TOPICAL_OIL | Freq: Once | INTRAMUSCULAR | Status: AC
  Administered 2014-10-02: 250 mg via INTRAMUSCULAR

## 2014-10-02 NOTE — Addendum Note (Signed)
Addended by: Gerome ApleyZEYFANG, LINDA L on: 10/02/2014 09:53 AM   Modules accepted: Orders

## 2014-10-02 NOTE — Progress Notes (Signed)
17P today.  US last wekk 42% at 2 pounds 2 ounces.  Vaginal itching--wet prep.  GBS sent.  GC/Chlam Scant bleeding after cervical exam.

## 2014-10-02 NOTE — Progress Notes (Signed)
H Wier Siu used for interpreter; reports vaginal itching

## 2014-10-03 ENCOUNTER — Other Ambulatory Visit: Payer: Self-pay | Admitting: Obstetrics & Gynecology

## 2014-10-03 LAB — GC/CHLAMYDIA PROBE AMP
CT PROBE, AMP APTIMA: NEGATIVE
GC Probe RNA: NEGATIVE

## 2014-10-03 LAB — KOH PREP

## 2014-10-03 MED ORDER — FLUCONAZOLE 150 MG PO TABS: 150.0000 mg | ORAL_TABLET | Freq: Once | ORAL | Status: DC

## 2014-10-04 LAB — CULTURE, BETA STREP (GROUP B ONLY)

## 2014-10-04 NOTE — Progress Notes (Addendum)
Unable to contact pt because [employer] Interpreter for [place] not available at present time.  I scheduled an appt with interpreter for 4:30 pm today in order to call pt. [phone]  Received e-mail message from [facility] stating that they do not have a [name] interpreter available at the scheduled call time of 1630 today. Will need to try and call pt at another time.

## 2014-10-09 ENCOUNTER — Encounter: Payer: Self-pay | Admitting: Obstetrics and Gynecology

## 2014-10-09 ENCOUNTER — Ambulatory Visit (INDEPENDENT_AMBULATORY_CARE_PROVIDER_SITE_OTHER): Payer: Medicaid Other | Admitting: Obstetrics and Gynecology

## 2014-10-09 VITALS — BP 124/76 | HR 67 | Temp 97.6°F | Wt 139.7 lb

## 2014-10-09 DIAGNOSIS — O0992 Supervision of high risk pregnancy, unspecified, second trimester: Secondary | ICD-10-CM

## 2014-10-09 DIAGNOSIS — O09892 Supervision of other high risk pregnancies, second trimester: Secondary | ICD-10-CM

## 2014-10-09 DIAGNOSIS — O09212 Supervision of pregnancy with history of pre-term labor, second trimester: Secondary | ICD-10-CM

## 2014-10-09 DIAGNOSIS — O0933 Supervision of pregnancy with insufficient antenatal care, third trimester: Secondary | ICD-10-CM

## 2014-10-09 DIAGNOSIS — Z789 Other specified health status: Secondary | ICD-10-CM

## 2014-10-09 DIAGNOSIS — O09512 Supervision of elderly primigravida, second trimester: Secondary | ICD-10-CM

## 2014-10-09 LAB — POCT URINALYSIS DIP (DEVICE)
Bilirubin Urine: NEGATIVE
Glucose, UA: NEGATIVE mg/dL
HGB URINE DIPSTICK: NEGATIVE
Ketones, ur: NEGATIVE mg/dL
NITRITE: NEGATIVE
PH: 5.5 (ref 5.0–8.0)
PROTEIN: NEGATIVE mg/dL
Specific Gravity, Urine: 1.01 (ref 1.005–1.030)
UROBILINOGEN UA: 0.2 mg/dL (ref 0.0–1.0)

## 2014-10-09 NOTE — Progress Notes (Signed)
Patient is doing well without complaints. FM/labor precautions reviewed 

## 2014-10-09 NOTE — Progress Notes (Signed)
Used interpreter [employer] Rahlan. Informed patient of yeast noted last week and rx sent to pharmacy. Patient states she has already taken it.

## 2014-10-09 NOTE — Progress Notes (Signed)
Trace leuks noted in urine.

## 2014-10-14 ENCOUNTER — Inpatient Hospital Stay (HOSPITAL_COMMUNITY)
Admission: AD | Admit: 2014-10-14 | Discharge: 2014-10-16 | DRG: 775 | Disposition: A | Discharge status: Home / Self Care | Payer: Medicaid Other | Source: Ambulatory Visit | Attending: Obstetrics and Gynecology | Admitting: Obstetrics and Gynecology

## 2014-10-14 ENCOUNTER — Encounter (HOSPITAL_COMMUNITY): Payer: Self-pay

## 2014-10-14 DIAGNOSIS — Z3A37 37 weeks gestation of pregnancy: Secondary | ICD-10-CM | POA: Diagnosis present

## 2014-10-14 DIAGNOSIS — O09523 Supervision of elderly multigravida, third trimester: Secondary | ICD-10-CM | POA: Diagnosis not present

## 2014-10-14 DIAGNOSIS — O9989 Other specified diseases and conditions complicating pregnancy, childbirth and the puerperium: Principal | ICD-10-CM | POA: Diagnosis present

## 2014-10-14 DIAGNOSIS — O09512 Supervision of elderly primigravida, second trimester: Secondary | ICD-10-CM

## 2014-10-14 DIAGNOSIS — O0992 Supervision of high risk pregnancy, unspecified, second trimester: Secondary | ICD-10-CM

## 2014-10-14 DIAGNOSIS — IMO0001 Reserved for inherently not codable concepts without codable children: Secondary | ICD-10-CM

## 2014-10-14 DIAGNOSIS — O0933 Supervision of pregnancy with insufficient antenatal care, third trimester: Secondary | ICD-10-CM

## 2014-10-14 LAB — TYPE AND SCREEN
ABO/RH(D): AB POS
ANTIBODY SCREEN: NEGATIVE

## 2014-10-14 LAB — CBC
HEMATOCRIT: 33.3 % — AB (ref 36.0–46.0)
HEMOGLOBIN: 11.2 g/dL — AB (ref 12.0–15.0)
MCH: 28.4 pg (ref 26.0–34.0)
MCHC: 33.6 g/dL (ref 30.0–36.0)
MCV: 84.5 fL (ref 78.0–100.0)
Platelets: 284 10*3/uL (ref 150–400)
RBC: 3.94 MIL/uL (ref 3.87–5.11)
RDW: 15.9 % — ABNORMAL HIGH (ref 11.5–15.5)
WBC: 23.2 10*3/uL — ABNORMAL HIGH (ref 4.0–10.5)

## 2014-10-14 LAB — RPR: RPR Ser Ql: NONREACTIVE

## 2014-10-14 MED ORDER — DIPHENHYDRAMINE HCL 25 MG PO CAPS: 25.0000 mg | ORAL_CAPSULE | Freq: Four times a day (QID) | ORAL | Status: DC | PRN

## 2014-10-14 MED ORDER — LACTATED RINGERS IV SOLN: 500.0000 mL | INTRAVENOUS | Status: DC | PRN

## 2014-10-14 MED ORDER — SIMETHICONE 80 MG PO CHEW: 80.0000 mg | CHEWABLE_TABLET | ORAL | Status: DC | PRN

## 2014-10-14 MED ORDER — ONDANSETRON HCL 4 MG/2ML IJ SOLN: 4.0000 mg | Freq: Four times a day (QID) | INTRAMUSCULAR | Status: DC | PRN

## 2014-10-14 MED ORDER — ONDANSETRON HCL 4 MG/2ML IJ SOLN: 4.0000 mg | INTRAMUSCULAR | Status: DC | PRN

## 2014-10-14 MED ORDER — OXYCODONE-ACETAMINOPHEN 5-325 MG PO TABS: 2.0000 | ORAL_TABLET | ORAL | Status: DC | PRN

## 2014-10-14 MED ORDER — SENNOSIDES-DOCUSATE SODIUM 8.6-50 MG PO TABS
2.0000 | ORAL_TABLET | ORAL | Status: DC
  Administered 2014-10-15 – 2014-10-16 (×2): 2 via ORAL
  Filled 2014-10-14 (×2): qty 2

## 2014-10-14 MED ORDER — LIDOCAINE HCL (PF) 1 % IJ SOLN: 30.0000 mL | INTRAMUSCULAR | Status: DC | PRN

## 2014-10-14 MED ORDER — ACETAMINOPHEN 325 MG PO TABS: 650.0000 mg | ORAL_TABLET | ORAL | Status: DC | PRN

## 2014-10-14 MED ORDER — IBUPROFEN 600 MG PO TABS
600.0000 mg | ORAL_TABLET | Freq: Four times a day (QID) | ORAL | Status: DC
  Administered 2014-10-14 – 2014-10-16 (×10): 600 mg via ORAL
  Filled 2014-10-14 (×10): qty 1

## 2014-10-14 MED ORDER — PRENATAL MULTIVITAMIN CH
1.0000 | ORAL_TABLET | Freq: Every day | ORAL | Status: DC
  Administered 2014-10-14 – 2014-10-16 (×3): 1 via ORAL
  Filled 2014-10-14 (×3): qty 1

## 2014-10-14 MED ORDER — ZOLPIDEM TARTRATE 5 MG PO TABS: 5.0000 mg | ORAL_TABLET | Freq: Every evening | ORAL | Status: DC | PRN

## 2014-10-14 MED ORDER — LANOLIN HYDROUS EX OINT: TOPICAL_OINTMENT | CUTANEOUS | Status: DC | PRN

## 2014-10-14 MED ORDER — OXYCODONE-ACETAMINOPHEN 5-325 MG PO TABS: 1.0000 | ORAL_TABLET | ORAL | Status: DC | PRN

## 2014-10-14 MED ORDER — WITCH HAZEL-GLYCERIN EX PADS: 1.0000 "application " | MEDICATED_PAD | CUTANEOUS | Status: DC | PRN

## 2014-10-14 MED ORDER — TETANUS-DIPHTH-ACELL PERTUSSIS 5-2.5-18.5 LF-MCG/0.5 IM SUSP: 0.5000 mL | Freq: Once | INTRAMUSCULAR | Status: DC

## 2014-10-14 MED ORDER — OXYTOCIN 40 UNITS IN LACTATED RINGERS INFUSION - SIMPLE MED: 62.5000 mL/h | INTRAVENOUS | Status: DC

## 2014-10-14 MED ORDER — OXYTOCIN BOLUS FROM INFUSION: 500.0000 mL | INTRAVENOUS | Status: DC

## 2014-10-14 MED ORDER — CITRIC ACID-SODIUM CITRATE 334-500 MG/5ML PO SOLN: 30.0000 mL | ORAL | Status: DC | PRN

## 2014-10-14 MED ORDER — OXYTOCIN 10 UNIT/ML IJ SOLN
INTRAMUSCULAR | Status: AC
  Administered 2014-10-14: 10 [IU]
  Filled 2014-10-14: qty 1

## 2014-10-14 MED ORDER — LACTATED RINGERS IV SOLN: INTRAVENOUS | Status: DC

## 2014-10-14 MED ORDER — OXYCODONE-ACETAMINOPHEN 5-325 MG PO TABS
2.0000 | ORAL_TABLET | ORAL | Status: DC | PRN
Start: 2014-10-14 — End: 2014-10-16

## 2014-10-14 MED ORDER — BENZOCAINE-MENTHOL 20-0.5 % EX AERO: 1.0000 "application " | INHALATION_SPRAY | CUTANEOUS | Status: DC | PRN

## 2014-10-14 MED ORDER — ONDANSETRON HCL 4 MG PO TABS: 4.0000 mg | ORAL_TABLET | ORAL | Status: DC | PRN

## 2014-10-14 MED ORDER — DIBUCAINE 1 % RE OINT: 1.0000 "application " | TOPICAL_OINTMENT | RECTAL | Status: DC | PRN

## 2014-10-14 NOTE — Progress Notes (Signed)
Patient speaks no english. No interpreter available at this time to do admission teaching. Family neighbor was used to assess patient, instruct patient on use of call bell, and not getting out of bed without the nurse. However, the neighbor also does not speak english fluently and was only able to translate select words.

## 2014-10-14 NOTE — MAU Note (Signed)
Brought pt back from lobby.  Having contractions.  Got on stretcher.  Bloody mucus. From [place], does not speak [place].

## 2014-10-14 NOTE — Progress Notes (Signed)
[facility] to [facility] and they stated they had no [name] interpreter. Attempted to call a number provided by the night nurse without an answer, left a message. Call was returned and caller stated she helped the jarai women to learn english. RN explained security, asked if they would like the infant to receive the hepatitis and explained and ordered meals. The women/interpreter on the phone had to go but said she could come in around 12pm Sun. 5/1.

## 2014-10-14 NOTE — Progress Notes (Signed)
[facility] to [facility] and they stated they had no [name] interpreter. Friends by bedside that spoke English some. Updated Feeding of baby. Discussed pain control for mom. Also asked about Hep B. Mom states she does want this after visitors leave  . So discussed will do Hep B later this evening.

## 2014-10-14 NOTE — Progress Notes (Signed)
CSW consult noted however per chart review, pt experienced depression while in [place]. Reports "everything is okay here." CSW intervention was not provided. Please reconsult if other concerns arise.  CSW attempted to arrange interpreting services however office was closed. If interpreting services are needed on Monday, please call [phone].

## 2014-10-14 NOTE — MAU Note (Signed)
Erythromycin ointment put in baby's eyes

## 2014-10-14 NOTE — H&P (Signed)
Tracie Tucker is a 44 y.o. female [id number] @ 37.5wks by LMP and c/w 21wk scan presenting for eval of labor. Unable to assess for ROM due to lang barrier. +bldy show. Unable to locate [name] interpreter prior to delivery as pt arrived in advanced labor/transition. [facility] and has been remarkable for 1) AMA 2) infant w/ EIF 3) hx PTD x 3 (32-34wks) w/ neonatal demises x 3  History OB History    Gravida Para Term Preterm AB TAB SAB Ectopic Multiple Living   8 7 4 3  0 0 0   4      Obstetric Comments         Past Medical History  Diagnosis Date  . Medical history non-contributory   . Depression     in [place], everything is ok here   Past Surgical History  Procedure Laterality Date  . Hand surgery      lump removed right hand   Family History: family history is negative for Heart disease and Diabetes. Social History:  reports that she has never smoked. She has never used smokeless tobacco. She reports that she does not drink alcohol or use illicit drugs.   Prenatal Transfer Tool  Maternal Diabetes: No Genetic Screening: Declined Maternal Ultrasounds/Referrals: Abnormal:  Findings:   Isolated EIF (echogenic intracardiac focus) Fetal Ultrasounds or other Referrals:  None Maternal Substance Abuse:  No Significant Maternal Medications:  None Significant Maternal Lab Results:  Lab values include: Group B Strep negative Other Comments:  None  ROS    Blood pressure 148/56, temperature 98.2 F (36.8 C), temperature source Oral, last menstrual period 01/23/2014. Exam Physical Exam  Constitutional: She is oriented to person, place, and time. She appears well-developed.  HENT:  Head: Normocephalic.  Neck: Normal range of motion.  Cardiovascular: Normal rate.   Respiratory: Effort normal.  GI:  No time to get FHT prior to delivery Appears to be contracting regularly  Musculoskeletal: Normal range of motion.  Neurological: She is alert and oriented to  person, place, and time.  Skin: Skin is warm and dry.  Psychiatric: She has a normal mood and affect. Her behavior is normal. Thought content normal.    Prenatal labs: ABO, Rh: --/--/AB POS, AB POS (12/29 1321) Antibody: NEG (12/29 1321) Rubella: 19.90 (12/29 1321) RPR: NON REAC (02/08 1609)  HBsAg: NEGATIVE (12/29 1321)  HIV: NONREACTIVE (02/08 1609)  GBS:     Assessment/Plan: IUP@term  Advanced labor  Prepare for delivery   Cam HaiSHAW, Anson Peddie CNM 10/14/2014, 2:28 AM

## 2014-10-14 NOTE — Progress Notes (Signed)
Pt arrived in lobby with family.  She does not speak [place].  Neighbor reports pt having contractions since 9 pm and held out as long as she could.  Pt went to use bathroom, only in there a minute with her friend and came out, went into room 6.  She changed into gown and sit on bed, laid back and I was able to get FHTs at 140's briefly.  She then beared down and pushed once, her water broke, light meconium mod amt noted on bed, then infant boy delivered on stretcher. He was crying almost immediately at delivery, dried infant and skin to skin on mom's abd.  Venia CarbonJennifer Rasch NP to room and then Philipp DeputyKim Shaw CNM, also at bedside.

## 2014-10-14 NOTE — Progress Notes (Signed)
Attempted to contact the interpreter provided by the patient via phone, but there was no answer. Nurse will pass phone number on and continue to try to reach an interpreter.

## 2014-10-15 NOTE — Progress Notes (Signed)
Post Partum Day 1 Subjective: No interpreter available at this time for assistance with [place] SpireJarai.  An interpreter at [phone], who will be paying a house call at 3pm today was unavailable for phone interpretation at this time.  Kateryn Melida QuitterRmah is a 44 y.o. [id number] 6535w5d s/p SVD in MAU.  No acute events overnight.  Pt denies problems with ambulating, voiding or po intake.  She denies nausea or vomiting.  Pain is well controlled.  Unsure of flatus. Unsure bowel movement.  Lochia Small.  Plan for birth control is unsure.  Method of Feeding: Breast  Objective: Blood pressure 96/66, pulse 67, temperature 98 F (36.7 C), temperature source Oral, resp. rate 18, last menstrual period 01/23/2014, SpO2 99 %, unknown if currently breastfeeding.  Physical Exam:  General: alert, cooperative and no distress Lochia:normal flow Chest: CTAB, no increased WOB Heart: RRR no m/r/g Abdomen: +BS, soft, nontender Uterine Fundus: firm, below level of umbillicus DVT Evaluation: No evidence of DVT seen on physical exam. Extremities: WWP, no edema, +2 DP   Recent Labs  10/14/14 0400  HGB 11.2*  HCT 33.3*    Assessment/Plan:  ASSESSMENT: Klyn Bero is a 44 y.o. [id number] 5635w5d s/p SVD  Plan for discharge tomorrow.  Per peds no discharge of baby today.   LOS: 1 day   Raliegh IpGottschalk, Ashly M, DO 10/15/2014, 12:50 PM    Addendum: Eli PhillipsV. Ksor, in person interpreter here to interpret [name].  Patient reports that she is doing well with Motrin.  Reports that she would like this at discharge if possible.  Bleeding has decreased to less than a period.  Ambulating independently, urinating normally, has not had a BM or flatus yet.  Eating and drinking well.  Wants DEPO for contraception.   -Anticipate dc tomorrow. -Will message staff to schedule 6 week PP visit for patient so that time is known at discharge, as patient has language barrier.  Ashly M. Nadine CountsGottschalk, DO PGY-1, [facility] 10/15/14, 2:45pm      D  Azalie Harbeck CNM

## 2014-10-15 NOTE — Progress Notes (Signed)
**  Interval Note**  Attempted to assess patient this morning.  No [name] interpreter available in house or by phone.  I was told by nurse an in house interpreter will be available around 12pm.  Will return to assess patient at that time.  Likely will be discharged if no barriers today.  Rakeen Gaillard M. Nadine CountsGottschalk, DO PGY-1, [facility]

## 2014-10-15 NOTE — Lactation Note (Signed)
This note was copied from the chart of Tracie Tucker. Lactation Consultation Note; Initial visit with interpreter present. Mom reports that baby has been nursing well with no pain. Just finished feeding for 10 min- asleep at mom's side at present. Last LS 10 by RN. Experienced BF mom reports she has BF her other babies 4-5 years each. No questions at present.   Patient Name: Tracie Meredith LeedsBung Calles UJWJX'BToday's Date: 10/15/2014 Reason for consult: Initial assessment   Maternal Data Formula Feeding for Exclusion: No Does the patient have breastfeeding experience prior to this delivery?: Yes  Feeding   LATCH Score/Interventions   Lactation Tools Discussed/Used     Consult Status Consult Status: PRN    Pamelia HoitWeeks, Tram Wrenn D 10/15/2014, 2:29 PM

## 2014-10-16 ENCOUNTER — Encounter: Payer: Medicaid Other | Admitting: Obstetrics & Gynecology

## 2014-10-16 MED ORDER — IBUPROFEN 600 MG PO TABS: 600.0000 mg | ORAL_TABLET | Freq: Four times a day (QID) | ORAL | Status: DC

## 2014-10-16 MED ORDER — OXYCODONE-ACETAMINOPHEN 5-325 MG PO TABS: 1.0000 | ORAL_TABLET | ORAL | Status: DC | PRN

## 2014-10-16 NOTE — Progress Notes (Signed)
[employer] call for interpretation at 1015 this am. No [place] dialect of [name] interpreter available. Friend called Vung, Ksor [phone] called and she said she would call back at 1115 this am . She did call back at 1120 and assisted with discharge instructions for birth registrar, lactation,  Baby's MD(Dr Manson PasseyBrown) and RN.Ms Robello given sitz bath with instructions for use thru dad.

## 2014-10-16 NOTE — Lactation Note (Signed)
This note was copied from the chart of Tracie Oriah Miyasaki. Lactation Consultation Note  Patient Name: Tracie Tucker ZDGLO'VToday's Date: 10/16/2014 Reason for consult: Follow-up assessment  With this mom and now term baby, born at 837 5/[redacted] weeks gestation, and 31589 hours old. The baby has not problem transferring milk due to mom's abundant supply. I got a hand pump for mom, and showed her how to pump for comfort and to soften her breasts before latching. I  showed mom how to obtain a deeper latch, to increase her nipple comfort. I also showed her to pre pump with hand pump, to soften areola and nipple, to make latching easier. Dad shown how to assist mom with using hand pump. Engorgement care reviewed. Milk storage reviewed. Dad to have their English speaking friend review the Baby and Me book with thme, which will have the number for them to call lactation for questions/concerns.    Maternal Data    Feeding Feeding Type: Breast Fed Length of feed: 10 min  LATCH Score/Interventions Latch: Grasps breast easily, tongue down, lips flanged, rhythmical sucking. Intervention(s): Adjust position;Assist with latch;Breast compression  Audible Swallowing: Spontaneous and intermittent Intervention(s): Skin to skin Intervention(s): Hand expression  Type of Nipple: Everted at rest and after stimulation  Comfort (Breast/Nipple): Filling, red/small blisters or bruises, mild/mod discomfort  Problem noted: Filling Interventions (Filling): Hand pump (mom extremly full, not engorged yet. )  Hold (Positioning): Assistance needed to correctly position infant at breast and maintain latch. Intervention(s): Breastfeeding basics reviewed;Support Pillows;Position options;Skin to skin  LATCH Score: 8  Lactation Tools Discussed/Used Pump Review: Setup, frequency, and cleaning;Milk Storage Initiated by:: clee rn ibclc Date initiated:: 10/16/14   Consult Status Consult Status: Complete    Alfred LevinsLee, Dahlia Nifong  Anne 10/16/2014, 12:34 PM

## 2014-10-16 NOTE — Progress Notes (Signed)
Ur chart review completed.  

## 2014-10-16 NOTE — Discharge Summary (Signed)
Obstetric Discharge Summary Reason for Admission: onset of labor Prenatal Procedures: ultrasound Intrapartum Procedures: spontaneous vaginal delivery Postpartum Procedures: none Complications-Operative and Postpartum: none HEMOGLOBIN  Date Value Ref Range Status  10/14/2014 11.2* 12.0 - 15.0 g/dL Final   HCT  Date Value Ref Range Status  10/14/2014 33.3* 36.0 - 46.0 % Final    Physical Exam:  General: alert, cooperative, appears stated age and no distress Lochia: appropriate Uterine Fundus: firm Incision: n/a DVT Evaluation: No evidence of DVT seen on physical exam. Negative Homan's sign. No cords or calf tenderness.  Discharge Diagnoses: Term Pregnancy-delivered  Discharge Information: Date: 10/16/2014 Activity: pelvic rest Diet: routine Medications: PNV, Ibuprofen and Percocet Condition: stable and improved Instructions: refer to practice specific booklet Discharge to: home   Newborn Data: Live born female  Birth Weight: 5 lb 15.2 oz (2700 g) APGAR: 9, 9  Home with mother.  Haleigh Desmith DARLENE 10/16/2014, 7:23 AM

## 2014-11-22 ENCOUNTER — Ambulatory Visit: Payer: Medicaid Other | Admitting: Obstetrics & Gynecology

## 2015-01-18 ENCOUNTER — Ambulatory Visit: Payer: Medicaid Other | Admitting: Obstetrics and Gynecology

## 2015-04-19 ENCOUNTER — Encounter: Payer: Self-pay | Admitting: Family Medicine

## 2015-04-19 ENCOUNTER — Ambulatory Visit (INDEPENDENT_AMBULATORY_CARE_PROVIDER_SITE_OTHER): Payer: Medicaid Other | Admitting: Family Medicine

## 2015-04-19 VITALS — BP 135/86 | HR 68 | Temp 98.2°F | Ht <= 58 in | Wt 126.5 lb

## 2015-04-19 DIAGNOSIS — Z30013 Encounter for initial prescription of injectable contraceptive: Secondary | ICD-10-CM

## 2015-04-19 DIAGNOSIS — N644 Mastodynia: Secondary | ICD-10-CM

## 2015-04-19 DIAGNOSIS — Z3202 Encounter for pregnancy test, result negative: Secondary | ICD-10-CM | POA: Diagnosis not present

## 2015-04-19 LAB — POCT PREGNANCY, URINE: PREG TEST UR: NEGATIVE

## 2015-04-19 MED ORDER — MEDROXYPROGESTERONE ACETATE 150 MG/ML IM SUSP
150.0000 mg | Freq: Once | INTRAMUSCULAR | Status: AC
  Administered 2015-04-19: 150 mg via INTRAMUSCULAR

## 2015-04-19 NOTE — Addendum Note (Signed)
Addended by: Faythe CasaBELLAMY, Lindey Renzulli M on: 04/19/2015 10:37 AM   Modules accepted: Orders

## 2015-04-19 NOTE — Progress Notes (Signed)
   Subjective:    Patient ID: Tracie Tucker, female    DOB: 04/25/1971, 44 y.o.   MRN: [id number]  HPI Patient seen for contraception management.  Has not been on birth control since delivery of her baby in April.  Has been having regular menses, which she just started. Would like depo provera  Also complains of breast pain in breasts bilaterally since the delivery of her baby.  No radiation.  Has small lump.   Review of Systems  Constitutional: Negative for fever, chills and fatigue.  Genitourinary: Negative for vaginal bleeding, vaginal discharge and vaginal pain.       Objective:   Physical Exam  Constitutional: She appears well-developed and well-nourished.  HENT:  Head: Normocephalic and atraumatic.  Pulmonary/Chest: Right breast exhibits tenderness. Right breast exhibits no nipple discharge and no skin change. Left breast exhibits tenderness. Left breast exhibits no nipple discharge and no skin change.        Assessment & Plan:  1. Encounter for initial prescription of injectable contraceptive Depo provera  2. Negative pregnancy test   3. Breast pain in female Clogged milk duct.  Discussed ibuprofen, ice.

## 2015-04-19 NOTE — Progress Notes (Signed)
Language Resources Tracie Tucker Pt reports wanting depo provera for contraception; delivered baby in 10/14/14 and did not show for pp visit "they did not have interpreter"

## 2015-07-05 ENCOUNTER — Ambulatory Visit (INDEPENDENT_AMBULATORY_CARE_PROVIDER_SITE_OTHER): Payer: Medicaid Other | Admitting: *Deleted

## 2015-07-05 VITALS — BP 127/88 | HR 70 | Temp 97.6°F

## 2015-07-05 DIAGNOSIS — Z3042 Encounter for surveillance of injectable contraceptive: Secondary | ICD-10-CM

## 2015-07-05 MED ORDER — MEDROXYPROGESTERONE ACETATE 150 MG/ML IM SUSP
150.0000 mg | Freq: Once | INTRAMUSCULAR | Status: AC
  Administered 2015-07-05: 150 mg via INTRAMUSCULAR

## 2015-09-20 ENCOUNTER — Ambulatory Visit (INDEPENDENT_AMBULATORY_CARE_PROVIDER_SITE_OTHER): Payer: Medicaid Other | Admitting: General Practice

## 2015-09-20 VITALS — BP 135/85 | HR 64 | Ht 59.0 in | Wt 136.0 lb

## 2015-09-20 DIAGNOSIS — Z3042 Encounter for surveillance of injectable contraceptive: Secondary | ICD-10-CM

## 2015-09-20 MED ORDER — MEDROXYPROGESTERONE ACETATE 150 MG/ML IM SUSP
150.0000 mg | Freq: Once | INTRAMUSCULAR | Status: AC
  Administered 2015-09-20: 150 mg via INTRAMUSCULAR

## 2015-12-06 ENCOUNTER — Ambulatory Visit (INDEPENDENT_AMBULATORY_CARE_PROVIDER_SITE_OTHER): Payer: Medicaid Other

## 2015-12-06 VITALS — BP 110/72 | HR 67

## 2015-12-06 DIAGNOSIS — Z3042 Encounter for surveillance of injectable contraceptive: Secondary | ICD-10-CM

## 2015-12-06 MED ORDER — MEDROXYPROGESTERONE ACETATE 150 MG/ML IM SUSP
150.0000 mg | Freq: Once | INTRAMUSCULAR | Status: AC
  Administered 2015-12-06: 150 mg via INTRAMUSCULAR

## 2015-12-06 NOTE — Progress Notes (Signed)
Patient presented today for depo-provera injection. Injection was given in right left arm. Pt tolerated well and will follow up in three months.

## 2016-03-05 ENCOUNTER — Ambulatory Visit (INDEPENDENT_AMBULATORY_CARE_PROVIDER_SITE_OTHER): Payer: Medicaid Other

## 2016-03-05 VITALS — BP 132/89 | HR 80

## 2016-03-05 DIAGNOSIS — Z3042 Encounter for surveillance of injectable contraceptive: Secondary | ICD-10-CM | POA: Diagnosis present

## 2016-03-05 MED ORDER — MEDROXYPROGESTERONE ACETATE 150 MG/ML IM SUSP
150.0000 mg | Freq: Once | INTRAMUSCULAR | Status: AC
  Administered 2016-03-05: 150 mg via INTRAMUSCULAR

## 2016-03-05 NOTE — Progress Notes (Signed)
Patient presented today to office for her depo-provera. Patient tolerated well and will follow in three months. Patient verbalize understanding at this time.

## 2016-05-21 ENCOUNTER — Ambulatory Visit (INDEPENDENT_AMBULATORY_CARE_PROVIDER_SITE_OTHER): Payer: Medicaid Other | Admitting: *Deleted

## 2016-05-21 VITALS — BP 113/86 | HR 76 | Wt 140.4 lb

## 2016-05-21 DIAGNOSIS — Z3042 Encounter for surveillance of injectable contraceptive: Secondary | ICD-10-CM

## 2016-05-21 MED ORDER — MEDROXYPROGESTERONE ACETATE 150 MG/ML IM SUSP
150.0000 mg | Freq: Once | INTRAMUSCULAR | Status: AC
  Administered 2016-05-21: 150 mg via INTRAMUSCULAR

## 2016-05-21 NOTE — Progress Notes (Signed)
Interpreter Tracie AlkenWeir Tucker present for encounter.  Depo Provera given as scheduled.  Pt tolerated well.  Next injection due 2/21-08/20/16.

## 2016-07-23 ENCOUNTER — Encounter: Payer: Self-pay | Admitting: Obstetrics & Gynecology

## 2016-08-06 ENCOUNTER — Ambulatory Visit: Payer: Medicaid Other

## 2016-08-19 ENCOUNTER — Ambulatory Visit (INDEPENDENT_AMBULATORY_CARE_PROVIDER_SITE_OTHER): Payer: Medicaid Other | Admitting: Obstetrics & Gynecology

## 2016-08-19 ENCOUNTER — Encounter: Payer: Self-pay | Admitting: Obstetrics & Gynecology

## 2016-08-19 ENCOUNTER — Other Ambulatory Visit (HOSPITAL_COMMUNITY)
Admission: RE | Admit: 2016-08-19 | Discharge: 2016-08-19 | Disposition: A | Discharge status: Home / Self Care | Payer: Medicaid Other | Source: Ambulatory Visit | Attending: Obstetrics & Gynecology | Admitting: Obstetrics & Gynecology

## 2016-08-19 VITALS — BP 118/78 | HR 79 | Wt 140.1 lb

## 2016-08-19 DIAGNOSIS — Z309 Encounter for contraceptive management, unspecified: Secondary | ICD-10-CM

## 2016-08-19 DIAGNOSIS — Z789 Other specified health status: Secondary | ICD-10-CM

## 2016-08-19 DIAGNOSIS — Z3042 Encounter for surveillance of injectable contraceptive: Secondary | ICD-10-CM

## 2016-08-19 DIAGNOSIS — R1032 Left lower quadrant pain: Secondary | ICD-10-CM

## 2016-08-19 DIAGNOSIS — Z113 Encounter for screening for infections with a predominantly sexual mode of transmission: Secondary | ICD-10-CM | POA: Insufficient documentation

## 2016-08-19 MED ORDER — MEDROXYPROGESTERONE ACETATE 150 MG/ML IM SUSP
150.0000 mg | Freq: Once | INTRAMUSCULAR | Status: AC
  Administered 2016-08-19: 150 mg via INTRAMUSCULAR

## 2016-08-19 NOTE — Progress Notes (Signed)
Patient ID: Tracie Tucker Hoch, female   DOB: 06/23/1970, 46 y.o.   MRN: [id number]  Chief Complaint  Patient presents with  . Injections  . Gynecologic Exam    HPI Tracie Tucker Cullin is a 46 y.o. female.  [id number] No LMP recorded. Patient has had an injection. Due for DMPA, last pap normal 2 year ago and doesn't need to be done today.  HPI  Past Medical History:  Diagnosis Date  . Depression    in [place], everything is ok here  . Medical history non-contributory     Past Surgical History:  Procedure Laterality Date  . HAND SURGERY     lump removed right hand    Family History  Problem Relation Age of Onset  . Heart disease Neg Hx   . Diabetes Neg Hx     Social History Social History  Substance Use Topics  . Smoking status: Never Smoker  . Smokeless tobacco: Never Used  . Alcohol use No    Allergies  Allergen Reactions  . Other Swelling    Eggplant, certain type of fish- cause tongue to swell    Current Outpatient Prescriptions  Medication Sig Dispense Refill  . ibuprofen (ADVIL,MOTRIN) 600 MG tablet Take 1 tablet (600 mg total) by mouth every 6 (six) hours. (Patient not taking: Reported on 08/19/2016) 30 tablet 0  . prenatal vitamin w/FE, FA (PRENATAL 1 + 1) 27-1 MG TABS tablet Take 1 tablet by mouth daily at 12 noon. (Patient not taking: Reported on 08/19/2016) 30 each 4   Current Facility-Administered Medications  Medication Dose Route Frequency Provider Last Rate Last Dose  . medroxyPROGESTERone (DEPO-PROVERA) injection 150 mg  150 mg Intramuscular Once Adam PhenixJames G Arnold, MD        Review of Systems Review of Systems  Constitutional: Negative.   Gastrointestinal: Positive for abdominal pain (LLQ, radiates to back). Negative for constipation, diarrhea and vomiting.  Musculoskeletal: Positive for back pain.    Blood pressure 118/78, pulse 79, weight 140 lb 1.6 oz (63.5 kg), currently breastfeeding.  Physical Exam Physical Exam  Constitutional: She appears well-developed. No  distress.  Cardiovascular: Normal rate.   Pulmonary/Chest: Effort normal. No respiratory distress.  Breasts: breasts appear normal, no suspicious masses, no skin or nipple changes or axillary nodes.   Abdominal: Soft. There is no tenderness. There is no rebound and no guarding.  Genitourinary: Vagina normal and uterus normal. No vaginal discharge found.  Skin: Skin is warm and dry.  Psychiatric: She has a normal mood and affect. Her behavior is normal.    Data Reviewed Pap result  Assessment    LLQ pain, sx seem GI in nature Depo provera injection  Plan    DMPA Pelvic US RTC 3 mo for depo       Scheryl DarterJames Arnold 08/19/2016, 3:25 PM

## 2016-08-19 NOTE — Patient Instructions (Signed)
Pelvic Pain, Female Pelvic pain is pain in your lower abdomen, below your belly button and between your hips. The pain may start suddenly (acute), keep coming back (recurring), or last a long time (chronic). Pelvic pain that lasts longer than six months is considered chronic. Pelvic pain may affect your:  Reproductive organs.  Urinary system.  Digestive tract.  Musculoskeletal system. There are many potential causes of pelvic pain. Sometimes, the pain can be a result of digestive or urinary conditions, strained muscles or ligaments, or even reproductive conditions. Sometimes the cause of pelvic pain is not known. Follow these instructions at home:  Take over-the-counter and prescription medicines only as told by your health care provider.  Rest as told by your health care provider.  Do not have sex it if hurts.  Keep a journal of your pelvic pain. Write down:  When the pain started.  Where the pain is located.  What seems to make the pain better or worse, such as food or your menstrual cycle.  Any symptoms you have along with the pain.  Keep all follow-up visits as told by your health care provider. This is important. Contact a health care provider if:  Medicine does not help your pain.  Your pain comes back.  You have new symptoms.  You have abnormal vaginal discharge or bleeding, including bleeding after menopause.  You have a fever or chills.  You are constipated.  You have blood in your urine or stool.  You have foul-smelling urine.  You feel weak or lightheaded. Get help right away if:  You have sudden severe pain.  Your pain gets steadily worse.  You have severe pain along with fever, nausea, vomiting, or excessive sweating.  You lose consciousness. This information is not intended to replace advice given to you by your health care provider. Make sure you discuss any questions you have with your health care provider. Document Released: 04/29/2004  Document Revised: 06/27/2015 Document Reviewed: 03/23/2015 Elsevier Interactive Patient Education  2017 Elsevier Inc.  

## 2016-08-20 LAB — CERVICOVAGINAL ANCILLARY ONLY
Chlamydia: NEGATIVE
Neisseria Gonorrhea: NEGATIVE

## 2016-08-27 ENCOUNTER — Encounter (HOSPITAL_COMMUNITY): Payer: Self-pay | Admitting: Radiology

## 2016-08-27 ENCOUNTER — Ambulatory Visit (HOSPITAL_COMMUNITY): Payer: Medicaid Other

## 2016-08-27 ENCOUNTER — Ambulatory Visit (HOSPITAL_COMMUNITY)
Admission: RE | Admit: 2016-08-27 | Discharge: 2016-08-27 | Disposition: A | Discharge status: Home / Self Care | Payer: Medicaid Other | Source: Ambulatory Visit | Attending: Obstetrics & Gynecology | Admitting: Obstetrics & Gynecology

## 2016-08-27 DIAGNOSIS — R1032 Left lower quadrant pain: Secondary | ICD-10-CM | POA: Insufficient documentation

## 2016-08-27 DIAGNOSIS — M549 Dorsalgia, unspecified: Secondary | ICD-10-CM | POA: Insufficient documentation

## 2016-09-16 ENCOUNTER — Ambulatory Visit (INDEPENDENT_AMBULATORY_CARE_PROVIDER_SITE_OTHER): Payer: Medicaid Other | Admitting: Physician Assistant

## 2016-09-19 ENCOUNTER — Ambulatory Visit (INDEPENDENT_AMBULATORY_CARE_PROVIDER_SITE_OTHER): Payer: Self-pay | Admitting: Physician Assistant

## 2016-09-19 ENCOUNTER — Encounter (INDEPENDENT_AMBULATORY_CARE_PROVIDER_SITE_OTHER): Payer: Self-pay

## 2016-09-19 DIAGNOSIS — Z5321 Procedure and treatment not carried out due to patient leaving prior to being seen by health care provider: Secondary | ICD-10-CM

## 2016-09-25 ENCOUNTER — Encounter (INDEPENDENT_AMBULATORY_CARE_PROVIDER_SITE_OTHER): Payer: Self-pay | Admitting: Physician Assistant

## 2016-09-25 NOTE — Progress Notes (Signed)
Pt was checked in but decided to leave because she wanted to have orange card and [employer] applications done prior to being seen. She left without being seen.

## 2016-11-04 ENCOUNTER — Ambulatory Visit: Payer: Medicaid Other

## 2017-03-06 ENCOUNTER — Encounter (HOSPITAL_COMMUNITY): Payer: Self-pay | Admitting: Emergency Medicine

## 2017-03-06 ENCOUNTER — Ambulatory Visit (HOSPITAL_COMMUNITY)
Admission: EM | Admit: 2017-03-06 | Discharge: 2017-03-06 | Disposition: A | Discharge status: Home / Self Care | Payer: Worker's Compensation | Attending: Internal Medicine | Admitting: Internal Medicine

## 2017-03-06 DIAGNOSIS — S300XXA Contusion of lower back and pelvis, initial encounter: Secondary | ICD-10-CM

## 2017-03-06 DIAGNOSIS — W19XXXA Unspecified fall, initial encounter: Secondary | ICD-10-CM

## 2017-03-06 DIAGNOSIS — T148XXA Other injury of unspecified body region, initial encounter: Secondary | ICD-10-CM

## 2017-03-06 DIAGNOSIS — S20222A Contusion of left back wall of thorax, initial encounter: Secondary | ICD-10-CM

## 2017-03-06 MED ORDER — TRAMADOL HCL 50 MG PO TABS: ORAL_TABLET | ORAL | 0 refills | Status: DC

## 2017-03-06 MED ORDER — NAPROXEN 250 MG PO TABS: 250.0000 mg | ORAL_TABLET | Freq: Two times a day (BID) | ORAL | 0 refills | Status: DC

## 2017-03-06 NOTE — Discharge Instructions (Signed)
No evidence of any broken bones. Lungs are clear. No cough or other symptoms found to suggest lung injury. She has bruised her back and right hip and thigh. Apply ice to the sore areas off and on for the next couple days. Then switch to heat. She may also take the medicine as needed for pain. It may make her a little sleepy. No work for the next 3 days.

## 2017-03-06 NOTE — ED Triage Notes (Signed)
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 Works at [employer] fell backwards... C/o lower back pain and bilateral leg pain  A&O x4... NAD... Ambulatory

## 2017-03-06 NOTE — ED Provider Notes (Addendum)
[facility]    CSN: [id number] Arrival date & time: 03/06/17  1812     History   Chief Complaint Chief Complaint  Patient presents with  . Fall    HPI Tracie Tucker is a 46 y.o. female.   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 She was standing on the ground. She is complaining of pain and soreness in the right buttock and the right lateral 5, the left flank and left parathoracic musculature. She is ambulatory with good balance. She has full memory, recall of the incident. Did not strike her head. She has had no head injury symptoms. History obtained through daughter who speaks [place] and interprets for her.      Past Medical History:  Diagnosis Date  . Depression    in [place], everything is ok here  . Medical history non-contributory     Patient Active Problem List   Diagnosis Date Noted  . Language barrier affecting health care 06/13/2014    Past Surgical History:  Procedure Laterality Date  . HAND SURGERY     lump removed right hand    OB History    Gravida Para Term Preterm AB Living   0 4   SAB TAB Ectopic Multiple Live Births   0 0   0 7      Obstetric Comments   3 fetal demise in [place]        Home Medications    Prior to Admission medications   Not on File    Family History Family History  Problem Relation Age of Onset  . Heart disease Neg Hx   . Diabetes Neg Hx     Social History Social History  Substance Use Topics  . Smoking status: Never Smoker  . Smokeless tobacco: Never Used  . Alcohol use No     Allergies   Other   Review of Systems Review of Systems  Constitutional: Negative.   HENT: Negative.   Eyes: Negative.   Respiratory: Negative.  Negative for cough and shortness of breath.   Gastrointestinal: Negative.   Musculoskeletal:       As per history of present illness  Skin: Negative.  Negative for wound.    Neurological: Negative.   All other systems reviewed and are negative.    Physical Exam Triage Vital Signs ED Triage Vitals  Enc Vitals Group     BP 03/06/17 1900 128/77     Pulse Rate 03/06/17 1900 67     Resp 03/06/17 1900 16     Temp 03/06/17 1900 99.3 F (37.4 C)     Temp Source 03/06/17 1900 Oral     SpO2 03/06/17 1900 100 %     Weight --      Height --      Head Circumference --      Peak Flow --      Pain Score 03/06/17 1901 10     Pain Loc --      Pain Edu? --      Excl. in GC? --    No data found.   Updated Vital Signs BP 128/77 (BP Location: Left Arm)   Pulse 67   Temp 99.3 F (37.4 C) (Oral)   Resp 16   SpO2 100%   Visual Acuity Right Eye Distance:   Left Eye Distance:   Bilateral Distance:    Right Eye Near:  Left Eye Near:    Bilateral Near:     Physical Exam  Constitutional: She is oriented to person, place, and time. She appears well-developed and well-nourished. No distress.  HENT:  Head: Normocephalic and atraumatic.  Eyes: EOM are normal.  Neck: Normal range of motion. Neck supple.  Cardiovascular: Normal rate, regular rhythm, normal heart sounds and intact distal pulses.   Pulmonary/Chest: Effort normal and breath sounds normal. No respiratory distress. She has no wheezes. She exhibits tenderness.  Abdominal: Soft. There is no tenderness.  Musculoskeletal:  Minor tenderness just to the left of the upper sternum. There is tenderness to the left para thoracic or lumbar musculature. Tenderness to the right lower parasacral musculature, right lateral buttock and lateral aspect of the right thigh. This is merely soft tissue tenderness. No bony tenderness. Patient is able to get onto and off the exam table without assistance. She is able to walk with a slight antalgic gait in the room. Able to bear full weight. Able to stand and abduct the right lower extremity, able to stand and support weight on the right lower extremity.  Neurological: She is  alert and oriented to person, place, and time. No cranial nerve deficit.  Skin: Skin is warm and dry. Capillary refill takes less than 2 seconds.  Psychiatric: She has a normal mood and affect.  Nursing note and vitals reviewed.    UC Treatments / Results  Labs (all labs ordered are listed, but only abnormal results are displayed) Labs Reviewed - No data to display  EKG  EKG Interpretation None       Radiology No results found.  Procedures Procedures (including critical care time)  Medications Ordered in UC Medications - No data to display   Initial Impression / Assessment and Plan / UC Course  I have reviewed the triage vital signs and the nursing notes.  Pertinent labs & imaging results that were available during my care of the patient were reviewed by me and considered in my medical decision making (see chart for details).    No evidence of any broken bones. Lungs are clear. No cough or other symptoms found to suggest lung injury. She has bruised her back and right hip and thigh. Apply ice to the sore areas off and on for the next couple days. Then switch to heat. She may also take the medicine as needed for pain. It may make her a little sleepy. No work for the next 3 days.     Final Clinical Impressions(s) / UC Diagnoses   Final diagnoses:  Fall, initial encounter  Contusion of buttock, initial encounter  Back contusion, left, initial encounter  Muscle strain    New Prescriptions Current Discharge Medication List       Controlled Substance Prescriptions [place] Controlled Substance Registry consulted? Not Applicable   Hayden Rasmussen, NP 03/06/17 Babette Relic    Hayden Rasmussen, NP 03/06/17 2047

## 2017-04-10 ENCOUNTER — Ambulatory Visit (HOSPITAL_COMMUNITY)
Admission: EM | Admit: 2017-04-10 | Discharge: 2017-04-10 | Disposition: A | Discharge status: Home / Self Care | Payer: Worker's Compensation | Attending: Emergency Medicine | Admitting: Emergency Medicine

## 2017-04-10 ENCOUNTER — Encounter (HOSPITAL_COMMUNITY): Payer: Self-pay | Admitting: Emergency Medicine

## 2017-04-10 DIAGNOSIS — F329 Major depressive disorder, single episode, unspecified: Secondary | ICD-10-CM | POA: Insufficient documentation

## 2017-04-10 DIAGNOSIS — Z3202 Encounter for pregnancy test, result negative: Secondary | ICD-10-CM | POA: Diagnosis not present

## 2017-04-10 DIAGNOSIS — M549 Dorsalgia, unspecified: Secondary | ICD-10-CM | POA: Diagnosis present

## 2017-04-10 DIAGNOSIS — K625 Hemorrhage of anus and rectum: Secondary | ICD-10-CM | POA: Diagnosis present

## 2017-04-10 DIAGNOSIS — S39012A Strain of muscle, fascia and tendon of lower back, initial encounter: Secondary | ICD-10-CM | POA: Insufficient documentation

## 2017-04-10 DIAGNOSIS — N939 Abnormal uterine and vaginal bleeding, unspecified: Secondary | ICD-10-CM

## 2017-04-10 DIAGNOSIS — X58XXXA Exposure to other specified factors, initial encounter: Secondary | ICD-10-CM | POA: Diagnosis not present

## 2017-04-10 LAB — POCT I-STAT, CHEM 8
BUN: 19 mg/dL (ref 6–20)
CALCIUM ION: 1.19 mmol/L (ref 1.15–1.40)
Chloride: 107 mmol/L (ref 101–111)
Creatinine, Ser: 0.8 mg/dL (ref 0.44–1.00)
GLUCOSE: 89 mg/dL (ref 65–99)
HCT: 43 % (ref 36.0–46.0)
Hemoglobin: 14.6 g/dL (ref 12.0–15.0)
Potassium: 3.7 mmol/L (ref 3.5–5.1)
Sodium: 142 mmol/L (ref 135–145)
TCO2: 24 mmol/L (ref 22–32)

## 2017-04-10 LAB — POCT URINALYSIS DIP (DEVICE)
Bilirubin Urine: NEGATIVE
Glucose, UA: NEGATIVE mg/dL
Ketones, ur: NEGATIVE mg/dL
Leukocytes, UA: NEGATIVE
Nitrite: NEGATIVE
PROTEIN: 30 mg/dL — AB
SPECIFIC GRAVITY, URINE: 1.02 (ref 1.005–1.030)
UROBILINOGEN UA: 0.2 mg/dL (ref 0.0–1.0)
pH: 7 (ref 5.0–8.0)

## 2017-04-10 LAB — POCT PREGNANCY, URINE: Preg Test, Ur: NEGATIVE

## 2017-04-10 MED ORDER — NAPROXEN 250 MG PO TABS: 250.0000 mg | ORAL_TABLET | Freq: Two times a day (BID) | ORAL | 0 refills | Status: DC

## 2017-04-10 MED ORDER — NAPROXEN 500 MG PO TABS: 500.0000 mg | ORAL_TABLET | Freq: Two times a day (BID) | ORAL | 0 refills | Status: AC

## 2017-04-10 NOTE — ED Triage Notes (Signed)
Pt here w/friend who is interpreting Tracie Tucker  Reports persistent left lower back pain ... Has been here in the past for similar sx  Reports she works at [employer] and is constantly picking up heavy objects.   Pain increases w/activity.... Denies inj/trauma  Also reports small amount of blood in the stool and it's only once in a while  A&O x4... NAD... Ambulatory

## 2017-04-10 NOTE — ED Provider Notes (Signed)
[facility]    CSN: [id number] Arrival date & time: 04/10/17  1722     History   Chief Complaint Chief Complaint  Patient presents with  . Back Pain  . Rectal Bleeding    HPI Tracie Tucker is a 46 y.o. female.   46 year old female comes in with friend for multiple complaints:   1 month history of low back pain. Pain is on the left side of the back that is intermittent in nature and worse with movement. Denies numbness/tingling, loss of bladder/bowel control. Patient works at [employer] and needs to pick up heavy objects during work. States pain is the worse after a day of work. Denies injury/trauma. Has not tried anything for the pain.   Has had hematuria for a few weeks. (confirmed not rectal bleeding). States LMP 03/25/2017 and has been finished. Denies dysuria. Patient will not answer if experiencing urinary frequency. Denies fever, chills, night sweats. Low abdominal pain that is intermittent. Denies nausea, vomiting. Currently sexually active with one partner without condom use. Records states patient is getting injections as birth control, but patient will not confirm will deny. Denies vaginal discharge, itching/pain. Normal BMs, last one this morning without straining.   Friend/daugther acting as Equities traderinterpreter.       Past Medical History:  Diagnosis Date  . Depression    in [place], everything is ok here  . Medical history non-contributory     Patient Active Problem List   Diagnosis Date Noted  . Language barrier affecting health care 06/13/2014    Past Surgical History:  Procedure Laterality Date  . HAND SURGERY     lump removed right hand    OB History    Gravida Para Term Preterm AB Living   8 8 5 3  0 4   SAB TAB Ectopic Multiple Live Births   0 0   0 7      Obstetric Comments   3 fetal demise in [place]        Home Medications    Prior to Admission medications   Medication Sig Start Date End Date Taking? Authorizing  Provider  traMADol (ULTRAM) 50 MG tablet Take one half to one tablet by mouth every 4-6 hours when necessary pain 03/06/17  Yes Mabe, Onalee Huaavid, NP  naproxen (NAPROSYN) 500 MG tablet Take 1 tablet (500 mg total) by mouth 2 (two) times daily with a meal. 04/10/17 04/20/17  Belinda FisherYu, Laster Appling V, PA-C    Family History Family History  Problem Relation Age of Onset  . Heart disease Neg Hx   . Diabetes Neg Hx     Social History Social History  Substance Use Topics  . Smoking status: Never Smoker  . Smokeless tobacco: Never Used  . Alcohol use No     Allergies   Other   Review of Systems Review of Systems  Reason unable to perform ROS: See HPI as above.     Physical Exam Triage Vital Signs ED Triage Vitals  Enc Vitals Group     BP 04/10/17 1739 139/88     Pulse Rate 04/10/17 1739 65     Resp 04/10/17 1739 20     Temp 04/10/17 1739 98 F (36.7 C)     Temp Source 04/10/17 1739 Oral     SpO2 04/10/17 1739 100 %     Weight --      Height --      Head Circumference --      Peak Flow --  Pain Score 04/10/17 1741 2     Pain Loc --      Pain Edu? --      Excl. in GC? --    No data found.   Updated Vital Signs BP 139/88 (BP Location: Left Arm)   Pulse 65   Temp 98 F (36.7 C) (Oral)   Resp 20   LMP 03/25/2017   SpO2 100%   Breastfeeding? No   Physical Exam  Constitutional: She is oriented to person, place, and time. She appears well-developed and well-nourished. No distress.  Eyes: Pupils are equal, round, and reactive to light. Conjunctivae are normal.  Cardiovascular: Normal rate, regular rhythm and normal heart sounds.  Exam reveals no gallop and no friction rub.   No murmur heard. Pulmonary/Chest: Effort normal and breath sounds normal. She has no wheezes. She has no rales.  Abdominal: Soft. Bowel sounds are normal. She exhibits no distension. There is no tenderness. There is no rebound, no guarding and no CVA tenderness.  Genitourinary: Uterus normal. Uterus is not  tender. Cervix exhibits no motion tenderness, no discharge and no friability. Right adnexum displays no mass and no tenderness. Left adnexum displays no mass and no tenderness. There is bleeding in the vagina.  Musculoskeletal:  Tenderness on palpation of left lower back. No tenderness on palpation of spinous processes/hips. Full ROM. Strength normal and equal bilaterally. Sensation intact and equal bilaterally. Negative straight leg raise  Neurological: She is alert and oriented to person, place, and time.  Skin: Skin is warm and dry.     UC Treatments / Results  Labs (all labs ordered are listed, but only abnormal results are displayed) Labs Reviewed  POCT URINALYSIS DIP (DEVICE) - Abnormal; Notable for the following:       Result Value   Hgb urine dipstick LARGE (*)    Protein, ur 30 (*)    All other components within normal limits  POCT PREGNANCY, URINE  POCT I-STAT, CHEM 8  CERVICOVAGINAL ANCILLARY ONLY    EKG  EKG Interpretation None       Radiology No results found.  Procedures Procedures (including critical care time)  Medications Ordered in UC Medications - No data to display   Initial Impression / Assessment and Plan / UC Course  I have reviewed the triage vital signs and the nursing notes.  Pertinent labs & imaging results that were available during my care of the patient were reviewed by me and considered in my medical decision making (see chart for details).    Discussed with patient history and exam most consistent with muscle strain. Start NSAID as directed for pain and inflammation. Ice/heat compresses. Discussed with patient strain can take up to 3-4 weeks to resolve, but should be getting better each week. Return precautions given.   Pelvic exam shows bleeding, likely cause of gross hematuria patient is complaining of. Urine dipstick negative for UTI or pregnancy. Patient has documented depot injection, last in March, patient unable to answer if she  has had another injection since. Istat normal without anemia. Patient to follow up with women[facility] for further evaluation of symptoms. Return precautions given.    Final Clinical Impressions(s) / UC Diagnoses   Final diagnoses:  Strain of lumbar region, initial encounter  Abnormal uterine bleeding    New Prescriptions Current Discharge Medication List        Lurline Idol 04/10/17 1858

## 2017-04-10 NOTE — Discharge Instructions (Signed)
Your exam was most consistent with muscle strain. Start Naproxen as directed. Ice/heat compresses as needed. This can take up to 3-4 weeks to completely resolve, but you should be feeling better each week. Follow up here or with PCP if symptoms worsen, changes for reevaluation. If experience numbness/tingling of the inner thighs, loss of bladder or bowel control, go to the emergency department for evaluation.   Your blood work was normal. Your bleeding is coming from the vaginal area, you will need to follow up with your OBGYN for further evaluation of symptoms. If experiencing worsening of symptoms, weakness, dizziness, abdominal pain, nausea, vomiting, fever, go to the emergency department for further evaluation.

## 2017-04-13 LAB — CERVICOVAGINAL ANCILLARY ONLY
BACTERIAL VAGINITIS: NEGATIVE
CHLAMYDIA, DNA PROBE: NEGATIVE
Candida vaginitis: NEGATIVE
NEISSERIA GONORRHEA: NEGATIVE
Trichomonas: NEGATIVE

## 2017-05-21 ENCOUNTER — Ambulatory Visit: Payer: Self-pay | Admitting: Obstetrics & Gynecology

## 2017-06-22 ENCOUNTER — Encounter: Payer: Self-pay | Admitting: Obstetrics and Gynecology

## 2017-06-22 ENCOUNTER — Ambulatory Visit (INDEPENDENT_AMBULATORY_CARE_PROVIDER_SITE_OTHER): Payer: Self-pay | Admitting: Obstetrics and Gynecology

## 2017-06-22 DIAGNOSIS — M545 Low back pain, unspecified: Secondary | ICD-10-CM

## 2017-06-22 DIAGNOSIS — G8929 Other chronic pain: Secondary | ICD-10-CM

## 2017-06-22 DIAGNOSIS — R102 Pelvic and perineal pain: Secondary | ICD-10-CM

## 2017-06-22 DIAGNOSIS — M549 Dorsalgia, unspecified: Secondary | ICD-10-CM | POA: Insufficient documentation

## 2017-06-22 DIAGNOSIS — R109 Unspecified abdominal pain: Secondary | ICD-10-CM

## 2017-06-22 NOTE — Progress Notes (Signed)
Patient ID: Tracie LeedsBung Dishner, female   DOB: 10/10/1970, 47 y.o.   MRN: [id number] Pt presents with c/o left back pain since a fall this past September.  She also concerns about an episode of vaginal bleeding that started shortly after the fall. She bleed for 10 days. No bleeding since Prior use of Depo Provera. Last injection March/April. Stopped d/t cost. Last pap in 2016 Sexual active without problems No bowel or bladder dysfunction Gyn U/S March 2018 normal.  PE AF VSS Lungs clear Heart RRR Abd soft + BS  Back left paraspinal muscle tenderness GU nl EGBUS cervix no lesions uterus small mobile non tender no adnexal masses or tenderness  A/P Abd/Pelvic Pain       Back pain       AUB       Contraception       Health maintance  Interpreter used during today's visit. Discussed with pt that the fall and bleeding were not related. Bleeding most likely related to her stopping Depo Provera. Explained to pt that her cycles may be irregular for 6-18 months since stopping Depo Provera. Discussed OCP's or restarting Depo Provera. Pt not sure what is would like to do. Cost is a factor.She will let us know what she decides. Recommended condoms in the meantime. Will refer to [facility] for pap smear and mammogram. Recommend seeing her PCP for her back pain which I suspect is MS and related to fall this past September.  F/U PRN

## 2017-06-22 NOTE — Progress Notes (Signed)
Pt stated have an accident to work Sept 2018 fell back ward her right buttock/back and start bleeding. Bleeding is better since prescribe medication but the lower back/Lt lower abdominal still painful.

## 2017-07-30 ENCOUNTER — Ambulatory Visit (INDEPENDENT_AMBULATORY_CARE_PROVIDER_SITE_OTHER): Payer: Medicaid Other | Admitting: Obstetrics and Gynecology

## 2017-07-30 ENCOUNTER — Other Ambulatory Visit (HOSPITAL_COMMUNITY)
Admission: RE | Admit: 2017-07-30 | Discharge: 2017-07-30 | Disposition: A | Discharge status: Home / Self Care | Payer: Medicaid Other | Source: Ambulatory Visit | Attending: Obstetrics and Gynecology | Admitting: Obstetrics and Gynecology

## 2017-07-30 ENCOUNTER — Encounter: Payer: Self-pay | Admitting: Obstetrics and Gynecology

## 2017-07-30 VITALS — BP 133/77 | HR 76 | Ht 59.5 in | Wt 128.0 lb

## 2017-07-30 DIAGNOSIS — Z01419 Encounter for gynecological examination (general) (routine) without abnormal findings: Secondary | ICD-10-CM | POA: Insufficient documentation

## 2017-07-30 DIAGNOSIS — Z1231 Encounter for screening mammogram for malignant neoplasm of breast: Secondary | ICD-10-CM

## 2017-07-30 NOTE — Progress Notes (Signed)
Here for annual exam/ pap. States does not want depo-provera or any other birth control. Asisted patient with filling out mammogram scholarship.

## 2017-07-30 NOTE — Patient Instructions (Signed)
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
## 2017-07-31 LAB — CYTOLOGY - PAP
Diagnosis: NEGATIVE
HPV: NOT DETECTED

## 2021-10-30 ENCOUNTER — Ambulatory Visit (HOSPITAL_COMMUNITY)
Admission: RE | Admit: 2021-10-30 | Discharge: 2021-10-30 | Disposition: A | Discharge status: Home / Self Care | Payer: Medicaid Other | Source: Ambulatory Visit | Attending: Family Medicine | Admitting: Family Medicine

## 2021-10-30 ENCOUNTER — Encounter (HOSPITAL_COMMUNITY): Payer: Self-pay

## 2021-10-30 VITALS — BP 154/91 | HR 72 | Temp 98.3°F | Resp 18

## 2021-10-30 DIAGNOSIS — M545 Low back pain, unspecified: Secondary | ICD-10-CM

## 2021-10-30 MED ORDER — TIZANIDINE HCL 4 MG PO CAPS: 4.0000 mg | ORAL_CAPSULE | Freq: Three times a day (TID) | ORAL | 0 refills | Status: DC

## 2021-10-30 MED ORDER — NAPROXEN 500 MG PO TABS: 500.0000 mg | ORAL_TABLET | Freq: Two times a day (BID) | ORAL | 0 refills | Status: DC

## 2021-10-30 NOTE — ED Triage Notes (Signed)
Pt c/o lower back pain radiating to lt hip since yesterday. Denies new injury or taking any meds. States she fell out of truck years ago and hurt her back. ?

## 2021-10-30 NOTE — Discharge Instructions (Signed)
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
## 2022-02-23 ENCOUNTER — Encounter (HOSPITAL_COMMUNITY): Payer: Self-pay | Admitting: *Deleted

## 2022-02-23 ENCOUNTER — Ambulatory Visit (HOSPITAL_COMMUNITY)
Admission: EM | Admit: 2022-02-23 | Discharge: 2022-02-23 | Disposition: A | Discharge status: Home / Self Care | Payer: Medicaid Other

## 2022-02-23 DIAGNOSIS — M6740 Ganglion, unspecified site: Secondary | ICD-10-CM | POA: Diagnosis not present

## 2022-02-23 NOTE — ED Triage Notes (Signed)
Pt through daughter states that left middle finger hurts and she is unable to make it straight X a few months. She has not be abel to do anything to help the pain.

## 2022-02-23 NOTE — Discharge Instructions (Addendum)
Can apply ice, take ibuprofen  Follow up with orthopedics
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
## 2023-07-08 ENCOUNTER — Ambulatory Visit (HOSPITAL_COMMUNITY)
Admission: EM | Admit: 2023-07-08 | Discharge: 2023-07-08 | Disposition: A | Discharge status: Home / Self Care | Payer: Medicaid Other | Attending: Family Medicine | Admitting: Family Medicine

## 2023-07-08 ENCOUNTER — Other Ambulatory Visit: Payer: Self-pay

## 2023-07-08 ENCOUNTER — Encounter (HOSPITAL_COMMUNITY): Payer: Self-pay | Admitting: *Deleted

## 2023-07-08 DIAGNOSIS — R1011 Right upper quadrant pain: Secondary | ICD-10-CM | POA: Diagnosis not present

## 2023-07-08 DIAGNOSIS — M545 Low back pain, unspecified: Secondary | ICD-10-CM | POA: Diagnosis not present

## 2023-07-08 LAB — POCT URINALYSIS DIP (MANUAL ENTRY)
Bilirubin, UA: NEGATIVE
Blood, UA: NEGATIVE
Glucose, UA: NEGATIVE mg/dL
Ketones, POC UA: NEGATIVE mg/dL
Nitrite, UA: NEGATIVE
Protein Ur, POC: NEGATIVE mg/dL
Spec Grav, UA: 1.015 (ref 1.010–1.025)
Urobilinogen, UA: 0.2 U/dL
pH, UA: 5.5 (ref 5.0–8.0)

## 2023-07-08 LAB — CBC WITH DIFFERENTIAL/PLATELET
Abs Immature Granulocytes: 0.01 10*3/uL (ref 0.00–0.07)
Basophils Absolute: 0 10*3/uL (ref 0.0–0.1)
Basophils Relative: 1 %
Eosinophils Absolute: 0.1 10*3/uL (ref 0.0–0.5)
Eosinophils Relative: 2 %
HCT: 39.7 % (ref 36.0–46.0)
Hemoglobin: 12.5 g/dL (ref 12.0–15.0)
Immature Granulocytes: 0 %
Lymphocytes Relative: 34 %
Lymphs Abs: 1.9 10*3/uL (ref 0.7–4.0)
MCH: 28.7 pg (ref 26.0–34.0)
MCHC: 31.5 g/dL (ref 30.0–36.0)
MCV: 91.3 fL (ref 80.0–100.0)
Monocytes Absolute: 0.4 10*3/uL (ref 0.1–1.0)
Monocytes Relative: 6 %
Neutro Abs: 3.3 10*3/uL (ref 1.7–7.7)
Neutrophils Relative %: 57 %
Platelets: 212 10*3/uL (ref 150–400)
RBC: 4.35 MIL/uL (ref 3.87–5.11)
RDW: 12.4 % (ref 11.5–15.5)
WBC: 5.7 10*3/uL (ref 4.0–10.5)
nRBC: 0 % (ref 0.0–0.2)

## 2023-07-08 LAB — COMPREHENSIVE METABOLIC PANEL
ALT: 10 U/L (ref 0–44)
AST: 25 U/L (ref 15–41)
Albumin: 4.1 g/dL (ref 3.5–5.0)
Alkaline Phosphatase: 49 U/L (ref 38–126)
Anion gap: 14 (ref 5–15)
BUN: 12 mg/dL (ref 6–20)
CO2: 24 mmol/L (ref 22–32)
Calcium: 9.4 mg/dL (ref 8.9–10.3)
Chloride: 104 mmol/L (ref 98–111)
Creatinine, Ser: 0.75 mg/dL (ref 0.44–1.00)
GFR, Estimated: >60 mL/min (ref >60)
Glucose, Bld: 84 mg/dL (ref 70–99)
Potassium: 3.9 mmol/L (ref 3.5–5.1)
Sodium: 142 mmol/L (ref 135–145)
Total Bilirubin: 0.5 mg/dL (ref 0.0–1.2)
Total Protein: 7.6 g/dL (ref 6.5–8.1)

## 2023-07-08 LAB — LIPASE, BLOOD: Lipase: 39 U/L (ref 11–51)

## 2023-07-08 MED ORDER — KETOROLAC TROMETHAMINE 30 MG/ML IJ SOLN
30.0000 mg | Freq: Once | INTRAMUSCULAR | Status: AC
  Administered 2023-07-08: 30 mg via INTRAMUSCULAR

## 2023-07-08 MED ORDER — KETOROLAC TROMETHAMINE 30 MG/ML IJ SOLN
INTRAMUSCULAR | Status: AC
  Filled 2023-07-08: qty 1

## 2023-07-08 NOTE — Discharge Instructions (Signed)
You have been seen today for abdominal and lower back pain. Your evaluation was not suggestive of any emergent condition requiring medical intervention at this time. However, some abdominal problems make take more time to appear. Therefore, it is very important for you to pay attention to any new symptoms or worsening of your current condition.  Please return here or to the Emergency Department immediately should you begin to feel worse in any way or have any of the following symptoms: increasing or different abdominal pain, persistent vomiting, inability to drink fluids, fevers, or shaking chills.   You have had labs (blood tests) sent today. We will call you with any significant abnormalities or if there is need to begin or change treatment or pursue further follow up.  You may also review your test results online through MyChart. If you do not have a MyChart account, instructions to sign up should be on your discharge paperwork.

## 2023-07-08 NOTE — ED Triage Notes (Signed)
PT reports back pain on LT/RT . Rt back pain radiates to RT ABD. Pt had a fall from truck around 2019 and this pain is from the fall.
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
